# Patient Record
Sex: Male | Born: 1980 | Race: White | Hispanic: No | Marital: Married | Smoking: Never smoker
Health system: Southern US, Community
[De-identification: ages and names within clinical notes are randomized; demographics above are authoritative.]

## PROBLEM LIST (undated history)

## (undated) DIAGNOSIS — G43909 Migraine, unspecified, not intractable, without status migrainosus: Secondary | ICD-10-CM

## (undated) DIAGNOSIS — R197 Diarrhea, unspecified: Secondary | ICD-10-CM

## (undated) DIAGNOSIS — J302 Other seasonal allergic rhinitis: Secondary | ICD-10-CM

## (undated) DIAGNOSIS — K219 Gastro-esophageal reflux disease without esophagitis: Secondary | ICD-10-CM

## (undated) DIAGNOSIS — F32A Depression, unspecified: Secondary | ICD-10-CM

## (undated) DIAGNOSIS — F329 Major depressive disorder, single episode, unspecified: Secondary | ICD-10-CM

## (undated) DIAGNOSIS — M2550 Pain in unspecified joint: Secondary | ICD-10-CM

## (undated) DIAGNOSIS — N529 Male erectile dysfunction, unspecified: Secondary | ICD-10-CM

## (undated) DIAGNOSIS — M549 Dorsalgia, unspecified: Secondary | ICD-10-CM

## (undated) HISTORY — DX: Male erectile dysfunction, unspecified: N52.9

## (undated) HISTORY — DX: Depression, unspecified: F32.A

## (undated) HISTORY — DX: Major depressive disorder, single episode, unspecified: F32.9

## (undated) HISTORY — DX: Dorsalgia, unspecified: M54.9

## (undated) HISTORY — DX: Diarrhea, unspecified: R19.7

## (undated) HISTORY — DX: Gastro-esophageal reflux disease without esophagitis: K21.9

## (undated) HISTORY — DX: Pain in unspecified joint: M25.50

## (undated) HISTORY — DX: Other seasonal allergic rhinitis: J30.2

## (undated) HISTORY — PX: ANKLE SURGERY: SHX546

---

## 2006-07-23 HISTORY — PX: WISDOM TOOTH EXTRACTION: SHX21

## 2016-08-25 ENCOUNTER — Emergency Department (HOSPITAL_BASED_OUTPATIENT_CLINIC_OR_DEPARTMENT_OTHER): Payer: BC Managed Care – PPO

## 2016-08-25 ENCOUNTER — Emergency Department (HOSPITAL_BASED_OUTPATIENT_CLINIC_OR_DEPARTMENT_OTHER)
Admission: EM | Admit: 2016-08-25 | Discharge: 2016-08-25 | Disposition: A | Discharge status: Home / Self Care | Payer: BC Managed Care – PPO | Attending: Emergency Medicine | Admitting: Emergency Medicine

## 2016-08-25 ENCOUNTER — Encounter (HOSPITAL_BASED_OUTPATIENT_CLINIC_OR_DEPARTMENT_OTHER): Payer: Self-pay | Admitting: Emergency Medicine

## 2016-08-25 DIAGNOSIS — R509 Fever, unspecified: Secondary | ICD-10-CM | POA: Insufficient documentation

## 2016-08-25 DIAGNOSIS — J3489 Other specified disorders of nose and nasal sinuses: Secondary | ICD-10-CM | POA: Insufficient documentation

## 2016-08-25 DIAGNOSIS — R062 Wheezing: Secondary | ICD-10-CM | POA: Diagnosis not present

## 2016-08-25 DIAGNOSIS — J111 Influenza due to unidentified influenza virus with other respiratory manifestations: Secondary | ICD-10-CM

## 2016-08-25 DIAGNOSIS — R0602 Shortness of breath: Secondary | ICD-10-CM | POA: Insufficient documentation

## 2016-08-25 DIAGNOSIS — J029 Acute pharyngitis, unspecified: Secondary | ICD-10-CM | POA: Insufficient documentation

## 2016-08-25 DIAGNOSIS — R531 Weakness: Secondary | ICD-10-CM | POA: Diagnosis not present

## 2016-08-25 DIAGNOSIS — Z79899 Other long term (current) drug therapy: Secondary | ICD-10-CM | POA: Insufficient documentation

## 2016-08-25 DIAGNOSIS — R05 Cough: Secondary | ICD-10-CM | POA: Insufficient documentation

## 2016-08-25 DIAGNOSIS — R51 Headache: Secondary | ICD-10-CM | POA: Diagnosis not present

## 2016-08-25 DIAGNOSIS — R69 Illness, unspecified: Secondary | ICD-10-CM

## 2016-08-25 HISTORY — DX: Migraine, unspecified, not intractable, without status migrainosus: G43.909

## 2016-08-25 MED ORDER — IBUPROFEN 400 MG PO TABS
600.0000 mg | ORAL_TABLET | Freq: Once | ORAL | Status: AC
  Administered 2016-08-25: 600 mg via ORAL
  Filled 2016-08-25: qty 1

## 2016-08-25 NOTE — ED Triage Notes (Signed)
Pt reports cough, fever, sore throat, body aches since last night.

## 2016-08-25 NOTE — ED Provider Notes (Signed)
[facility]   CSN: [id number] Arrival date & time: 08/25/16  0311     History   Chief Complaint Chief Complaint  Patient presents with  . Influenza    HPI Mark Barrett is a 36 y.o. male.  HPI  36 year old male presents with fever, cough, sore throat, and body aches since yesterday morning. Woke up with the symptoms. He is a Mudlogger at school. Does not remember any specific sick contacts. Temperature has been up to 102. Took NyQuil last night around 11 PM. Has had a headache and his wife has heard him wheezing. He feels a little short of breath at times. Cough has green and brown sputum. Feels like his throat was red when he looked. No vomiting, abdominal pain, or diarrhea. No neck stiffness or pain. No history of asthma, immunosuppression/cancer.  Past Medical History:  Diagnosis Date  . Migraines     There are no active problems to display for this patient.   Past Surgical History:  Procedure Laterality Date  . ANKLE SURGERY         Home Medications    Prior to Admission medications   Medication Sig Start Date End Date Taking? Authorizing Provider  Topiramate (TOPAMAX PO) Take by mouth.   Yes Historical Provider, MD    Family History No family history on file.  Social History Social History  Substance Use Topics  . Smoking status: Never Smoker  . Smokeless tobacco: Never Used  . Alcohol use No     Allergies   Patient has no known allergies.   Review of Systems Review of Systems  Constitutional: Positive for fever.  HENT: Positive for congestion, sinus pressure and sore throat. Negative for ear pain.   Respiratory: Positive for cough, shortness of breath and wheezing.   Gastrointestinal: Negative for vomiting.  Neurological: Positive for weakness and headaches.  All other systems reviewed and are negative.    Physical Exam Updated Vital Signs BP 144/98   Pulse 97   Temp 100.5 F (38.1 C) (Oral)   Resp 18    SpO2 99%   Physical Exam  Constitutional: He is oriented to person, place, and time. He appears well-developed and well-nourished.  HENT:  Head: Normocephalic and atraumatic.  Right Ear: External ear normal.  Left Ear: External ear normal.  Nose: Nose normal.  Mouth/Throat: Uvula is midline and mucous membranes are normal. Posterior oropharyngeal erythema (mild) present. No oropharyngeal exudate or tonsillar abscesses.  Eyes: Right eye exhibits no discharge. Left eye exhibits no discharge.  Neck: Normal range of motion. Neck supple.  Cardiovascular: Normal rate, regular rhythm and normal heart sounds.   Pulmonary/Chest: Effort normal and breath sounds normal. He has no wheezes. He has no rales.  Abdominal: Soft. He exhibits no distension. There is no tenderness.  Musculoskeletal: He exhibits no edema.  Lymphadenopathy:    He has no cervical adenopathy.  Neurological: He is alert and oriented to person, place, and time.  Skin: Skin is warm and dry.  Nursing note and vitals reviewed.    ED Treatments / Results  Labs (all labs ordered are listed, but only abnormal results are displayed) Labs Reviewed - No data to display  EKG  EKG Interpretation None       Radiology Dg Chest 2 View  Result Date: 08/25/2016 CLINICAL DATA:  Cough and fever for 1 day EXAM: CHEST  2 VIEW COMPARISON:  None. FINDINGS: The heart size and mediastinal contours are within normal limits. Both  lungs are clear. The visualized skeletal structures are unremarkable. IMPRESSION: No active cardiopulmonary disease. Electronically Signed   By: Ellery Plunkaniel R Mitchell M.D.   On: 08/25/2016 03:50    Procedures Procedures (including critical care time)  Medications Ordered in ED Medications  ibuprofen (ADVIL,MOTRIN) tablet 600 mg (600 mg Oral Given 08/25/16 0344)     Initial Impression / Assessment and Plan / ED Course  I have reviewed the triage vital signs and the nursing notes.  Pertinent labs & imaging  results that were available during my care of the patient were reviewed by me and considered in my medical decision making (see chart for details).     Patient symptoms are consistent with influenza. No high risk features to suggest needing Tamiflu or admission. Discussed using ibuprofen, Tylenol, and over-the-counter remedies for his symptoms. Discussed strict return precautions. Chest x-ray without pneumonia and my suspicion from his pneumonia is low. Centor 1. Highly doubt bacterial infection  Final Clinical Impressions(s) / ED Diagnoses   Final diagnoses:  Influenza-like illness    New Prescriptions New Prescriptions   No medications on file     Pricilla LovelessScott Treshaun Carrico, MD 08/25/16 0405

## 2018-04-13 IMAGING — CR DG CHEST 2V
2 series · 2 of 2 positions shown · non-contrast
Comparison: None.

CLINICAL DATA: Cough and fever for 1 day

EXAM:
CHEST  2 VIEW

[w chest pa]
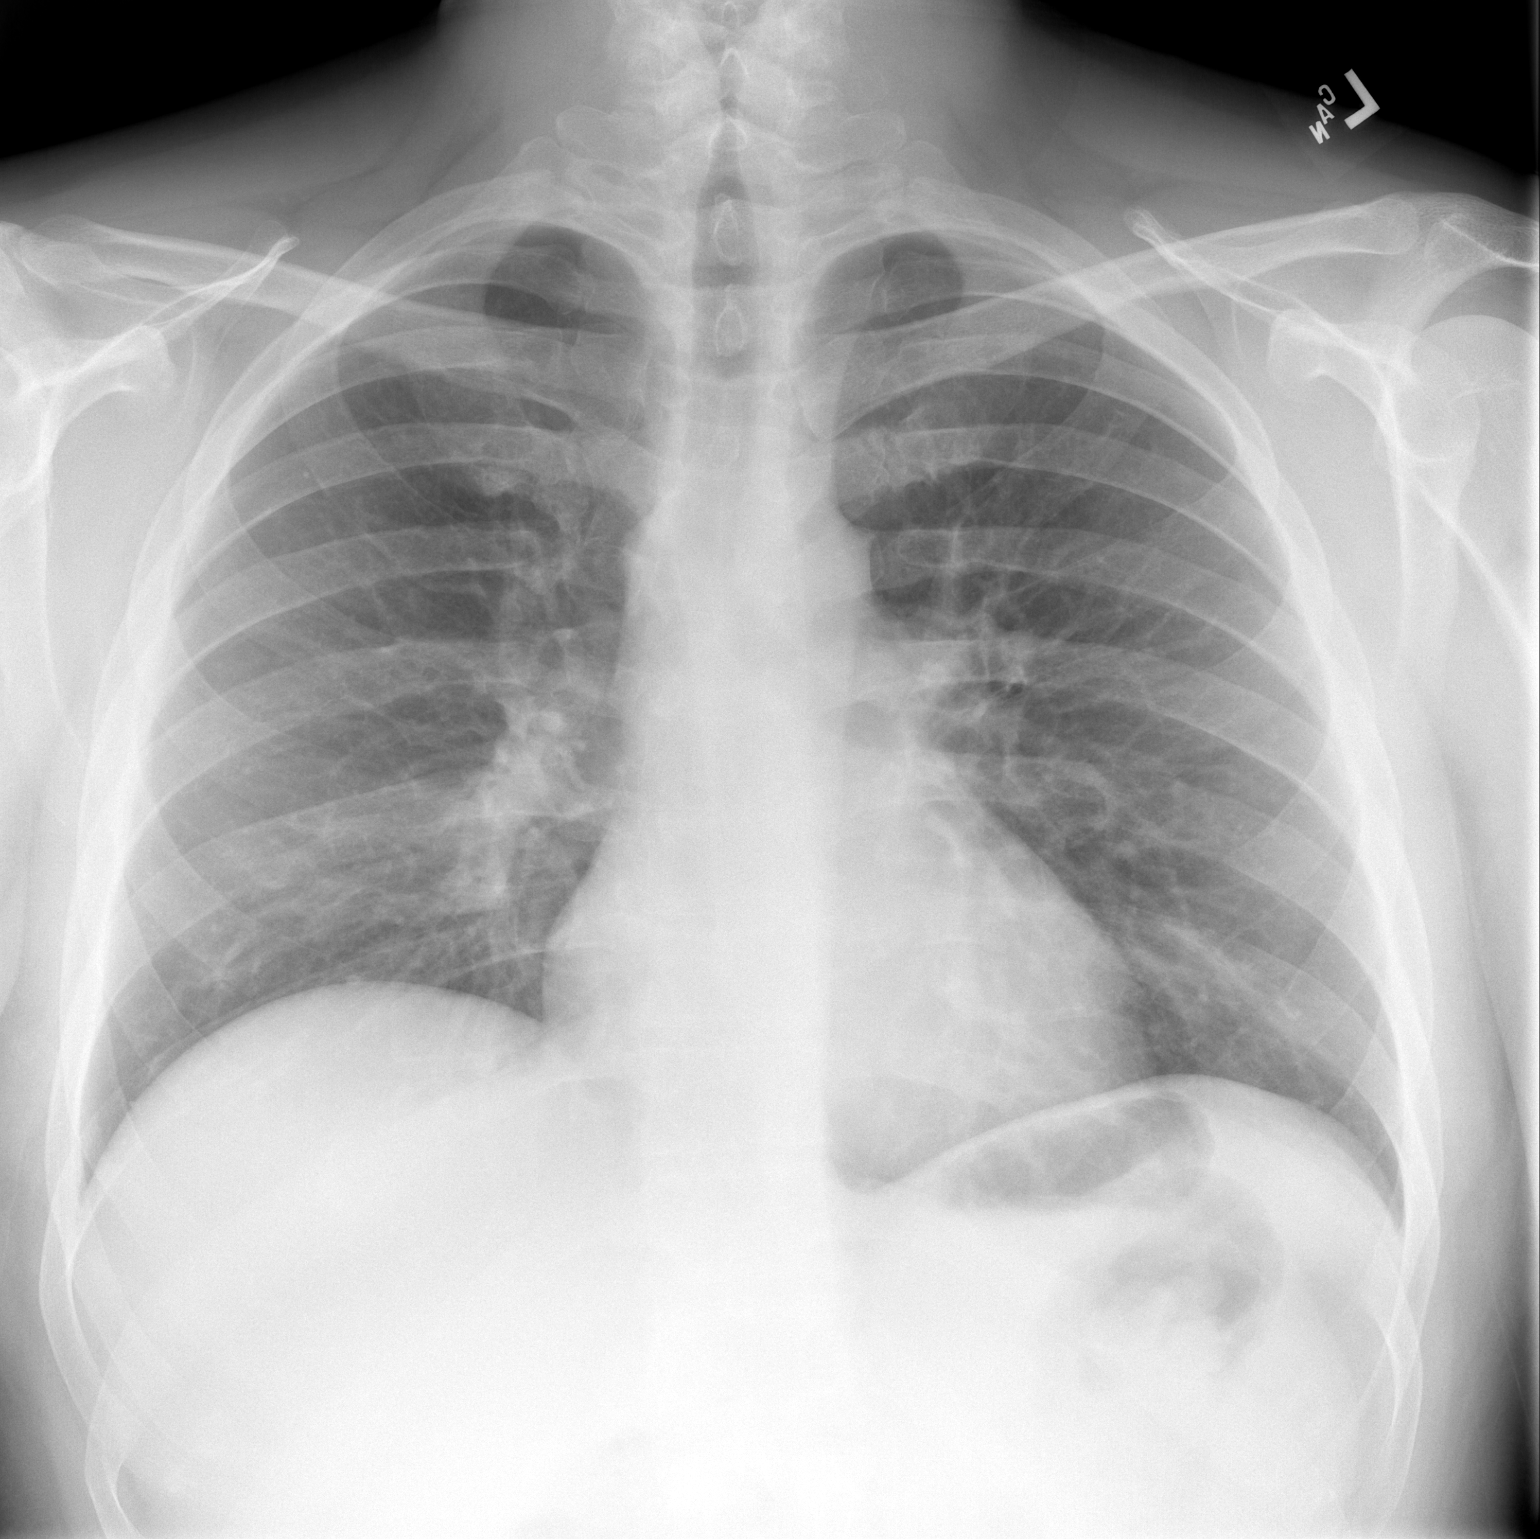

[w chest lat]
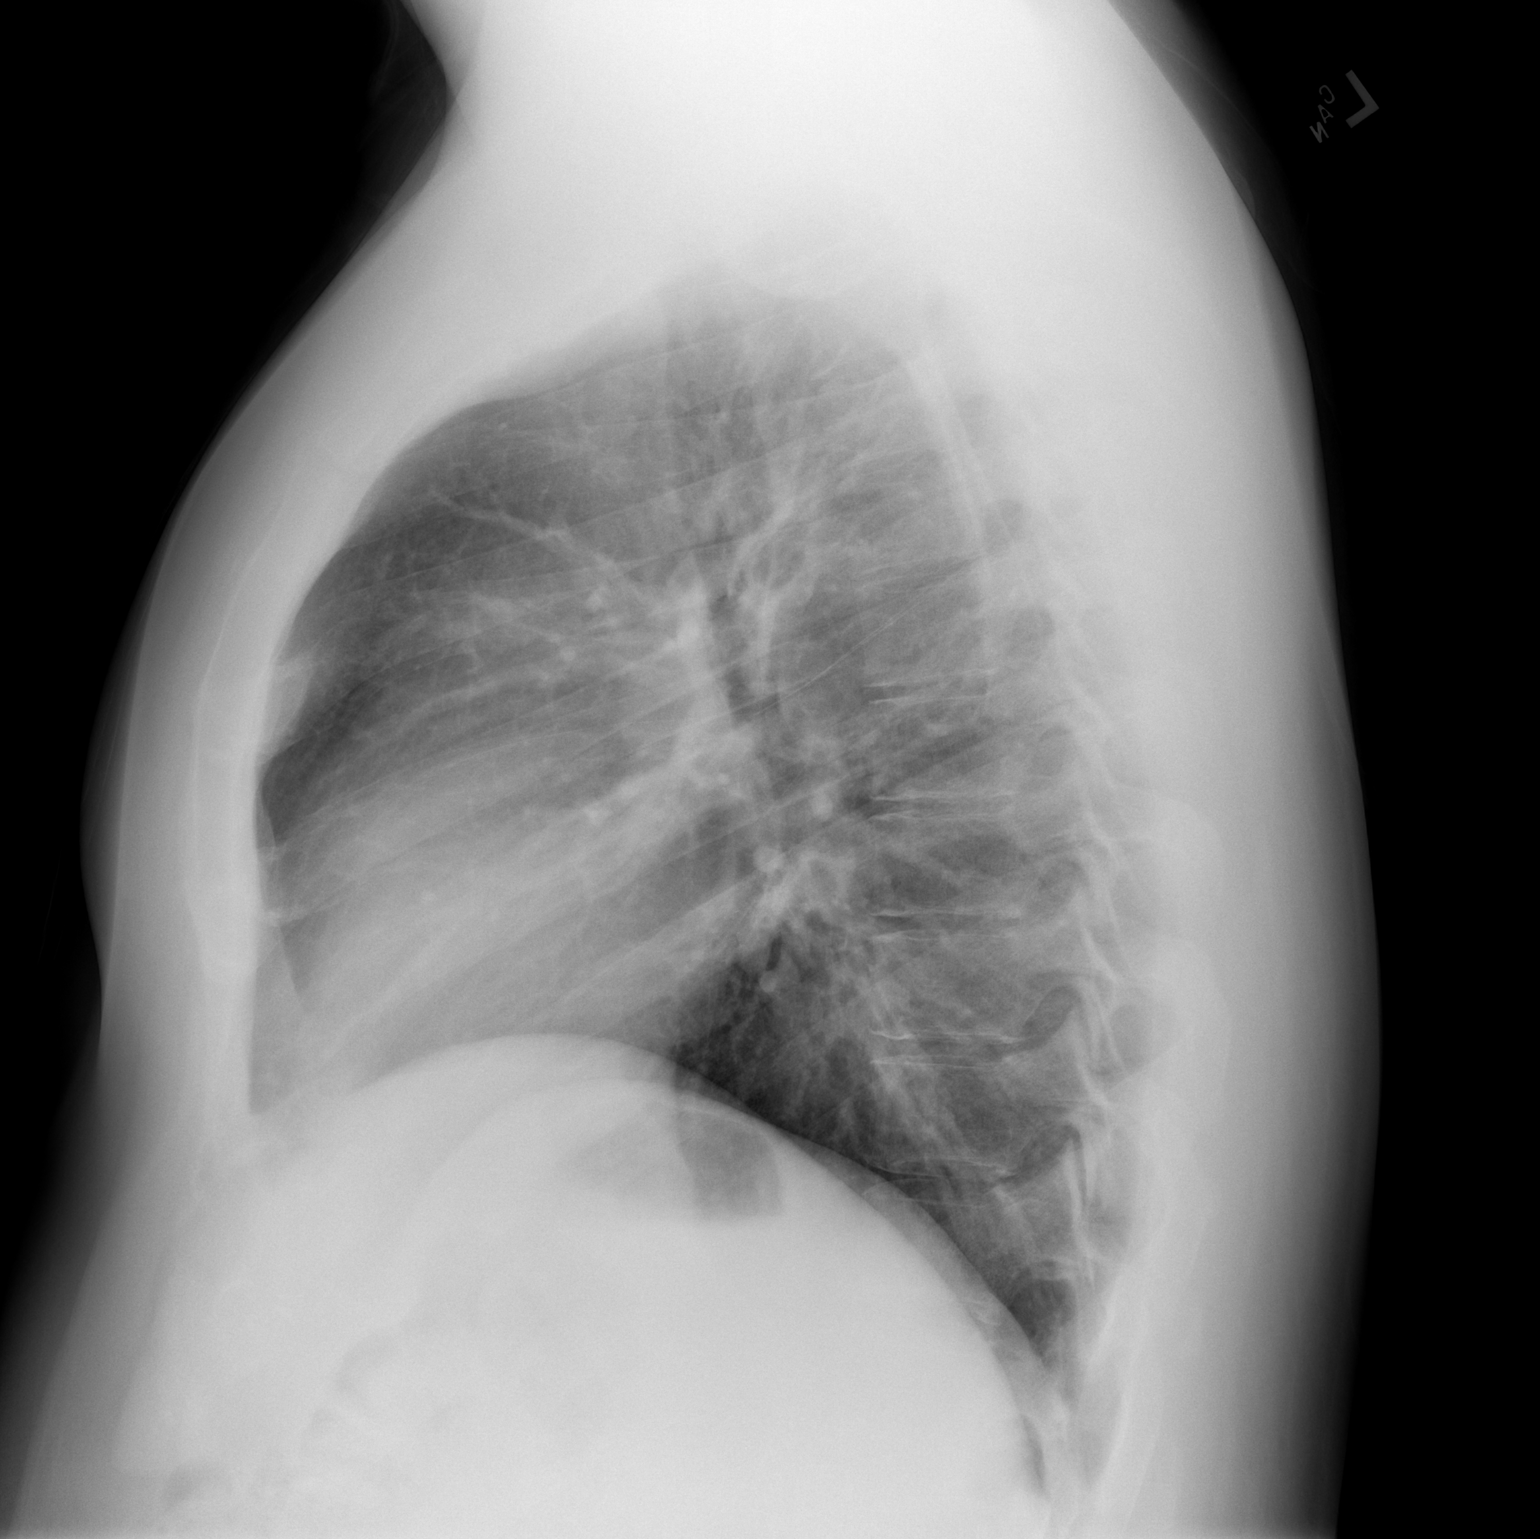

[2 of 2 positions shown; findings below may reference images not displayed]

FINDINGS: The heart size and mediastinal contours are within normal limits.
Both lungs are clear. The visualized skeletal structures are
unremarkable.
IMPRESSION: No active cardiopulmonary disease.

## 2018-05-08 ENCOUNTER — Encounter (INDEPENDENT_AMBULATORY_CARE_PROVIDER_SITE_OTHER): Payer: 59

## 2018-05-19 ENCOUNTER — Encounter (INDEPENDENT_AMBULATORY_CARE_PROVIDER_SITE_OTHER): Payer: Self-pay | Admitting: Family Medicine

## 2018-05-19 ENCOUNTER — Ambulatory Visit (INDEPENDENT_AMBULATORY_CARE_PROVIDER_SITE_OTHER): Payer: 59 | Admitting: Family Medicine

## 2018-05-19 VITALS — BP 141/82 | HR 68 | Temp 98.0°F | Ht 76.0 in | Wt 320.0 lb

## 2018-05-19 DIAGNOSIS — R0602 Shortness of breath: Secondary | ICD-10-CM | POA: Diagnosis not present

## 2018-05-19 DIAGNOSIS — Z9189 Other specified personal risk factors, not elsewhere classified: Secondary | ICD-10-CM | POA: Diagnosis not present

## 2018-05-19 DIAGNOSIS — G43809 Other migraine, not intractable, without status migrainosus: Secondary | ICD-10-CM

## 2018-05-19 DIAGNOSIS — R5383 Other fatigue: Secondary | ICD-10-CM | POA: Diagnosis not present

## 2018-05-19 DIAGNOSIS — Z1331 Encounter for screening for depression: Secondary | ICD-10-CM

## 2018-05-19 DIAGNOSIS — Z6839 Body mass index (BMI) 39.0-39.9, adult: Secondary | ICD-10-CM

## 2018-05-19 DIAGNOSIS — Z0289 Encounter for other administrative examinations: Secondary | ICD-10-CM

## 2018-05-20 LAB — T3: T3, Total: 103 ng/dL (ref 71–180)

## 2018-05-20 LAB — COMPREHENSIVE METABOLIC PANEL
ALBUMIN: 4.8 g/dL (ref 3.5–5.5)
ALK PHOS: 92 IU/L (ref 39–117)
ALT: 71 IU/L — ABNORMAL HIGH (ref 0–44)
AST: 32 IU/L (ref 0–40)
Albumin/Globulin Ratio: 1.8 (ref 1.2–2.2)
BUN/Creatinine Ratio: 23 — ABNORMAL HIGH (ref 9–20)
BUN: 23 mg/dL — AB (ref 6–20)
Bilirubin Total: 0.3 mg/dL (ref 0.0–1.2)
CALCIUM: 9.5 mg/dL (ref 8.7–10.2)
CO2: 20 mmol/L (ref 20–29)
CREATININE: 1 mg/dL (ref 0.76–1.27)
Chloride: 104 mmol/L (ref 96–106)
GFR, EST AFRICAN AMERICAN: 111 mL/min/{1.73_m2} (ref >59)
GFR, EST NON AFRICAN AMERICAN: 96 mL/min/{1.73_m2} (ref >59)
GLUCOSE: 94 mg/dL (ref 65–99)
Globulin, Total: 2.6 g/dL (ref 1.5–4.5)
Potassium: 4.4 mmol/L (ref 3.5–5.2)
SODIUM: 141 mmol/L (ref 134–144)
TOTAL PROTEIN: 7.4 g/dL (ref 6.0–8.5)

## 2018-05-20 LAB — LIPID PANEL WITH LDL/HDL RATIO
CHOLESTEROL TOTAL: 245 mg/dL — AB (ref 100–199)
HDL: 40 mg/dL (ref >39)
LDL Calculated: 173 mg/dL — ABNORMAL HIGH (ref 0–99)
LDl/HDL Ratio: 4.3 ratio — ABNORMAL HIGH (ref 0.0–3.6)
TRIGLYCERIDES: 162 mg/dL — AB (ref 0–149)
VLDL Cholesterol Cal: 32 mg/dL (ref 5–40)

## 2018-05-20 LAB — CBC WITH DIFFERENTIAL
BASOS ABS: 0.1 10*3/uL (ref 0.0–0.2)
Basos: 1 %
EOS (ABSOLUTE): 0.1 10*3/uL (ref 0.0–0.4)
Eos: 1 %
HEMATOCRIT: 44.2 % (ref 37.5–51.0)
HEMOGLOBIN: 15.2 g/dL (ref 13.0–17.7)
IMMATURE GRANS (ABS): 0 10*3/uL (ref 0.0–0.1)
Immature Granulocytes: 0 %
LYMPHS ABS: 1.5 10*3/uL (ref 0.7–3.1)
Lymphs: 22 %
MCH: 29.7 pg (ref 26.6–33.0)
MCHC: 34.4 g/dL (ref 31.5–35.7)
MCV: 86 fL (ref 79–97)
Monocytes Absolute: 0.6 10*3/uL (ref 0.1–0.9)
Monocytes: 8 %
NEUTROS ABS: 4.6 10*3/uL (ref 1.4–7.0)
Neutrophils: 68 %
RBC: 5.12 x10E6/uL (ref 4.14–5.80)
RDW: 12.8 % (ref 12.3–15.4)
WBC: 6.8 10*3/uL (ref 3.4–10.8)

## 2018-05-20 LAB — HEMOGLOBIN A1C
Est. average glucose Bld gHb Est-mCnc: 120 mg/dL
Hgb A1c MFr Bld: 5.8 % — ABNORMAL HIGH (ref 4.8–5.6)

## 2018-05-20 LAB — VITAMIN B12: Vitamin B-12: 505 pg/mL (ref 232–1245)

## 2018-05-20 LAB — TSH: TSH: 3.91 u[IU]/mL (ref 0.450–4.500)

## 2018-05-20 LAB — FOLATE: FOLATE: 9.2 ng/mL (ref >3.0)

## 2018-05-20 LAB — T4, FREE: FREE T4: 1 ng/dL (ref 0.82–1.77)

## 2018-05-20 LAB — INSULIN, RANDOM: INSULIN: 17.3 u[IU]/mL (ref 2.6–24.9)

## 2018-05-20 LAB — VITAMIN D 25 HYDROXY (VIT D DEFICIENCY, FRACTURES): Vit D, 25-Hydroxy: 20.2 ng/mL — ABNORMAL LOW (ref 30.0–100.0)

## 2018-05-20 NOTE — Progress Notes (Signed)
.  Office: [phone]  /  Fax: [phone]   HPI:   Chief Complaint: OBESITY  Mark Barrett (MR# [id number]) is a 37 y.o. male who presents on 05/20/2018 for obesity evaluation and treatment. Current BMI is Body mass index is 38.95 kg/m.Mark Barrett has struggled with obesity for years and has been unsuccessful in either losing weight or maintaining long term weight loss. Mark Barrett heard about our clinic from his wife. Mark Barrett attended our information session and states he is currently in the action stage of change and ready to dedicate time achieving and maintaining a healthier weight.  Mark Barrett states his family eats meals together he thinks his family will eat healthier with  him his desired weight loss is 20, 45 or 70 lbs he started gaining weight in the last few yrs his heaviest weight ever was 320 lbs. he is a picky eater and doesn't like to eat healthier foods  he has significant food cravings issues  he skips meals frequently he is frequently drinking liquids with calories he frequently makes poor food choices he has binge eating behaviors   Fatigue Mark Barrett feels his energy is lower than it should be. This has worsened with weight gain and has not worsened recently. Mark Barrett admits to daytime somnolence and admits to waking up still tired. Patient is at risk for obstructive sleep apnea. Patent has a history of symptoms of daytime fatigue, morning fatigue and morning headache. Patient generally gets 7 or 8 hours of sleep per night, and states they generally have restless sleep. Snoring is present. Apneic episodes are present. Epworth Sleepiness Score is 7  EKG was ordered today which shows normal sinus rhythm.  Dyspnea on exertion Mark Barrett notes increasing shortness of breath with exercising and seems to be worsening over time with weight gain. He notes getting out of breath sooner with activity than he used to. This has not gotten worse recently. EKG was ordered today which shows normal sinus rhythm.  Prabhav denies orthopnea.  Migraines Mark Barrett has a history of migraines and he is on Topiramate and Sumatriptan. He is not seeing a neurologist.  Depression Screen Mark Barrett Food and Mood (modified PHQ-9) score was  Depression screen PHQ 2/9 05/19/2018  Decreased Interest 1  Down, Depressed, Hopeless 1  PHQ - 2 Score 2  Altered sleeping 2  Tired, decreased energy 2  Change in appetite 1  Feeling bad or failure about yourself  1  Trouble concentrating 1  Moving slowly or fidgety/restless 2  Suicidal thoughts 1  PHQ-9 Score 12  Difficult doing work/chores Somewhat difficult    ALLERGIES: No Known Allergies  MEDICATIONS: Current Outpatient Medications on File Prior to Visit  Medication Sig Dispense Refill  . Ascorbic Acid (VITAMIN C) 100 MG tablet Take 250 mg by mouth daily.    . cetirizine (ZYRTEC) 10 MG tablet Take 10 mg by mouth daily.    Marland Kitchen ibuprofen (ADVIL,MOTRIN) 400 MG tablet Take 400 mg by mouth every 6 (six) hours as needed.    . sildenafil (VIAGRA) 25 MG tablet Take 25 mg by mouth daily as needed for erectile dysfunction.    . SUMAtriptan (IMITREX) 50 MG tablet Take 50 mg by mouth every 2 (two) hours as needed for migraine. May repeat in 2 hours if headache persists or recurs.    . Topiramate (TOPAMAX PO) Take by mouth.     No current facility-administered medications on file prior to visit.     PAST MEDICAL HISTORY: Past Medical History:  Diagnosis Date  .  Back pain   . Depression   . Diarrhea   . Erectile dysfunction   . GERD (gastroesophageal reflux disease)   . Joint pain   . Migraines   . Seasonal allergies     PAST SURGICAL HISTORY: Past Surgical History:  Procedure Laterality Date  . ANKLE SURGERY    . WISDOM TOOTH EXTRACTION  2008    SOCIAL HISTORY: Social History   Tobacco Use  . Smoking status: Never Smoker  . Smokeless tobacco: Never Used  Substance Use Topics  . Alcohol use: No  . Drug use: Not on file    FAMILY HISTORY: Family History   Problem Relation Age of Onset  . Hypertension Mother   . Hyperlipidemia Mother   . Obesity Mother   . Heart disease Father   . Cancer Father   . Sleep apnea Father     ROS: Review of Systems  Constitutional: Positive for malaise/fatigue.  HENT:       + Difficult or Painful Swallowing  Respiratory: Positive for shortness of breath (on exertion).   Cardiovascular: Negative for orthopnea.  Gastrointestinal: Positive for diarrhea and heartburn.  Musculoskeletal: Positive for back pain.       + Muscle or Joint Pain  Neurological: Positive for headaches.  Psychiatric/Behavioral: Positive for depression. The patient has insomnia.     PHYSICAL EXAM: Blood pressure (!) 141/82, pulse 68, temperature 98 F (36.7 C), temperature source Oral, height 6\' 4"  (1.93 m), weight (!) 320 lb (145.2 kg), SpO2 96 %. Body mass index is 38.95 kg/m. Physical Exam  Constitutional: He is oriented to person, place, and time. He appears well-developed and well-nourished.  HENT:  Head: Normocephalic and atraumatic.  Nose: Nose normal.  Eyes: EOM are normal.  Neck: Normal range of motion. Neck supple. No thyromegaly present.  Cardiovascular: Normal rate and regular rhythm.  Pulmonary/Chest: Effort normal. No respiratory distress.  Abdominal: Soft. There is no tenderness.  + Obesity  Musculoskeletal: Normal range of motion.  Range of Motion normal in all 4 extremities  Neurological: He is alert and oriented to person, place, and time.  Skin: Skin is warm and dry.  Psychiatric: He has a normal mood and affect. His behavior is normal.  Vitals reviewed.   RECENT LABS AND TESTS: BMET    Component Value Date/Time   NA 141 05/19/2018 1223   K 4.4 05/19/2018 1223   CL 104 05/19/2018 1223   CO2 20 05/19/2018 1223   GLUCOSE 94 05/19/2018 1223   BUN 23 (H) 05/19/2018 1223   CREATININE 1.00 05/19/2018 1223   CALCIUM 9.5 05/19/2018 1223   GFRNONAA 96 05/19/2018 1223   GFRAA 111 05/19/2018 1223    Lab Results  Component Value Date   HGBA1C 5.8 (H) 05/19/2018   Lab Results  Component Value Date   INSULIN 17.3 05/19/2018   CBC    Component Value Date/Time   WBC 6.8 05/19/2018 1223   RBC 5.12 05/19/2018 1223   HGB 15.2 05/19/2018 1223   HCT 44.2 05/19/2018 1223   MCV 86 05/19/2018 1223   MCH 29.7 05/19/2018 1223   MCHC 34.4 05/19/2018 1223   RDW 12.8 05/19/2018 1223   LYMPHSABS 1.5 05/19/2018 1223   EOSABS 0.1 05/19/2018 1223   BASOSABS 0.1 05/19/2018 1223   Iron/TIBC/Ferritin/ %Sat No results found for: IRON, TIBC, FERRITIN, IRONPCTSAT Lipid Panel     Component Value Date/Time   CHOL 245 (H) 05/19/2018 1223   TRIG 162 (H) 05/19/2018 1223   HDL 40  05/19/2018 1223   LDLCALC 173 (H) 05/19/2018 1223   Hepatic Function Panel     Component Value Date/Time   PROT 7.4 05/19/2018 1223   ALBUMIN 4.8 05/19/2018 1223   AST 32 05/19/2018 1223   ALT 71 (H) 05/19/2018 1223   ALKPHOS 92 05/19/2018 1223   BILITOT 0.3 05/19/2018 1223      Component Value Date/Time   TSH 3.910 05/19/2018 1223   Vitamin D There are no recent lab results  ECG  shows NSR with a rate of 75 BPM INDIRECT CALORIMETER done today shows a VO2 of 395 and a REE of 2748. His calculated basal metabolic rate is 5284 thus his basal metabolic rate is worse than expected.    ASSESSMENT AND PLAN: Other fatigue - Plan: EKG 12-Lead, Vitamin B12, CBC With Differential, Comprehensive metabolic panel, Folate, Hemoglobin A1c, Insulin, random, Lipid Panel With LDL/HDL Ratio, T3, T4, free, TSH, VITAMIN D 25 Hydroxy (Vit-D Deficiency, Fractures)  Shortness of breath on exertion  Other migraine without status migrainosus, not intractable  Depression screening  At risk for sleep apnea  Class 2 severe obesity with serious comorbidity and body mass index (BMI) of 39.0 to 39.9 in adult, unspecified obesity type (HCC)  PLAN:  Fatigue Mark Barrett was informed that his fatigue may be related to obesity,  depression or many other causes. Labs will be ordered, and in the meanwhile Mark Barrett has agreed to work on diet, exercise and weight loss to help with fatigue. Proper sleep hygiene was discussed including the need for 7-8 hours of quality sleep each night. We will refer to Neurology/Sleep Medicine for possible sleep study. We will order indirect calorimetry and EKG today.  Dyspnea on exertion Mark Barrett's shortness of breath appears to be obesity related and exercise induced. He has agreed to work on weight loss and gradually increase exercise to treat his exercise induced shortness of breath. If Jaimere follows our instructions and loses weight without improvement of his shortness of breath, we will plan to refer to pulmonology. We will order indirect calorimetry, EKG and labs today. We will monitor this condition regularly. Mark Barrett agrees to this plan.  Obstructive Sleep Apnea Risk Counseling Mark Barrett was given extended  (15 minutes) coronary artery disease prevention counseling today. He is 37 y.o. male and has risk factors for obstructive sleep apnea including obesity. We discussed intensive lifestyle modifications today with an emphasis on specific weight loss instructions and strategies.  Migraines Mark Barrett will continue Topiramate and Sumatriptan and will follow up with our clinic in 2 weeks.  Depression Screen Mark Barrett had a moderately positive depression screening. Depression is commonly associated with obesity and often results in emotional eating behaviors. We will monitor this closely and work on CBT to help improve the non-hunger eating patterns. Referral to Psychology may be required if no improvement is seen as he continues in our clinic.  Obesity Mark Barrett is currently in the action stage of change and his goal is to continue with weight loss efforts He has agreed to follow the Category 4 plan Mark Barrett has been instructed to work up to a goal of 150 minutes of combined cardio and strengthening exercise per  week for weight loss and overall health benefits. We discussed the following Behavioral Modification Strategies today: planning for success, increasing lean protein intake, increasing vegetables, decrease eating out and work on meal planning and easy cooking plans  Mark Barrett has agreed to follow up with our clinic in 2 weeks. He was informed of the importance of frequent follow  up visits to maximize his success with intensive lifestyle modifications for his multiple health conditions. He was informed we would discuss his lab results at his next visit unless there is a critical issue that needs to be addressed sooner. Mark Barrett agreed to keep his next visit at the agreed upon time to discuss these results.    OBESITY BEHAVIORAL INTERVENTION VISIT  Today's visit was # 1   Starting weight: 320 lbs Starting date: 05/19/18 Today's weight : 320 lbs  Today's date: 05/19/2018 Total lbs lost to date: 0   ASK: We discussed the diagnosis of obesity with Mark Barrett today and Mark Barrett agreed to give [place] permission to discuss obesity behavioral modification therapy today.  ASSESS: Mark Barrett has the diagnosis of obesity and his BMI today is 38.97 Mark Barrett is in the action stage of change   ADVISE: Jyair was educated on the multiple health risks of obesity as well as the benefit of weight loss to improve his health. He was advised of the need for long term treatment and the importance of lifestyle modifications to improve his current health and to decrease his risk of future health problems.  AGREE: Multiple dietary modification options and treatment options were discussed and  Mark Barrett agreed to follow the recommendations documented in the above note.  ARRANGE: Mark Barrett was educated on the importance of frequent visits to treat obesity as outlined per CMS and USPSTF guidelines and agreed to schedule his next follow up appointment today.   I, [place] Crane, am acting as transcriptionist for Filbert Schilder,  MD   I have reviewed the above documentation for accuracy and completeness, and I agree with the above. - Debbra Riding, MD

## 2018-06-04 ENCOUNTER — Ambulatory Visit (INDEPENDENT_AMBULATORY_CARE_PROVIDER_SITE_OTHER): Payer: 59 | Admitting: Family Medicine

## 2018-06-04 VITALS — BP 136/78 | HR 64 | Temp 98.2°F | Ht 76.0 in | Wt 318.0 lb

## 2018-06-04 DIAGNOSIS — E559 Vitamin D deficiency, unspecified: Secondary | ICD-10-CM | POA: Diagnosis not present

## 2018-06-04 DIAGNOSIS — Z9189 Other specified personal risk factors, not elsewhere classified: Secondary | ICD-10-CM

## 2018-06-04 DIAGNOSIS — R7303 Prediabetes: Secondary | ICD-10-CM

## 2018-06-04 DIAGNOSIS — E785 Hyperlipidemia, unspecified: Secondary | ICD-10-CM

## 2018-06-04 DIAGNOSIS — R5383 Other fatigue: Secondary | ICD-10-CM

## 2018-06-04 DIAGNOSIS — Z6838 Body mass index (BMI) 38.0-38.9, adult: Secondary | ICD-10-CM

## 2018-06-04 MED ORDER — VITAMIN D (ERGOCALCIFEROL) 1.25 MG (50000 UNIT) PO CAPS: 50000.0000 [IU] | ORAL_CAPSULE | ORAL | 0 refills | Status: DC

## 2018-06-10 ENCOUNTER — Encounter: Payer: Self-pay | Admitting: Neurology

## 2018-06-10 ENCOUNTER — Ambulatory Visit (INDEPENDENT_AMBULATORY_CARE_PROVIDER_SITE_OTHER): Payer: 59 | Admitting: Neurology

## 2018-06-10 VITALS — BP 142/82 | HR 72 | Ht 77.0 in | Wt 322.0 lb

## 2018-06-10 DIAGNOSIS — R519 Headache, unspecified: Secondary | ICD-10-CM

## 2018-06-10 DIAGNOSIS — R0683 Snoring: Secondary | ICD-10-CM

## 2018-06-10 DIAGNOSIS — Z9189 Other specified personal risk factors, not elsewhere classified: Secondary | ICD-10-CM

## 2018-06-10 DIAGNOSIS — R51 Headache: Secondary | ICD-10-CM

## 2018-06-10 DIAGNOSIS — G4719 Other hypersomnia: Secondary | ICD-10-CM

## 2018-06-10 DIAGNOSIS — R0681 Apnea, not elsewhere classified: Secondary | ICD-10-CM

## 2018-06-10 DIAGNOSIS — E669 Obesity, unspecified: Secondary | ICD-10-CM

## 2018-06-10 DIAGNOSIS — G478 Other sleep disorders: Secondary | ICD-10-CM

## 2018-06-10 DIAGNOSIS — Z820 Family history of epilepsy and other diseases of the nervous system: Secondary | ICD-10-CM

## 2018-06-10 NOTE — Progress Notes (Signed)
Office: [phone]  /  Fax: [phone]   HPI:   Chief Complaint: OBESITY Mark Barrett is here to discuss his progress with his obesity treatment plan. He is on the  follow the Category 4 plan and is following his eating plan approximately 50-60 % of the time. He states he is exercising 0 minutes 0 times per week. Mark Barrett reports the quantity of food is sufficient, but he sometimes struggles to eat all of the food and sometimes feels he could've eaten more at breakfast and lunch. He is eating breakfast at home occasionally eating out at lunch secondary to being in the on the run at work.  His weight is (!) 318 lb (144.2 kg) today and has had a weight loss of 2 pounds over a period of 2 weeks since his last visit. He has lost 2 lbs since starting treatment with [place].  Hyperlipidemia Mark Barrett has hyperlipidemia. His LDL significantly elevated and has been trying to improve his cholesterol levels with intensive lifestyle modification including a low saturated fat diet, exercise and weight loss. He denies any chest pain, claudication or myalgias. He is not currently on any medications.   Vitamin D deficiency Mark Barrett has a diagnosis of vitamin D deficiency. He is not currently taking vit D and denies nausea, vomiting or muscle weakness. He reports fatigue.   Ref. Range 05/19/2018 12:23  Vitamin D, 25-Hydroxy Latest Ref Range: 30.0 - 100.0 ng/mL 20.2 (L)   Pre-Diabetes Mark Barrett has a diagnosis of prediabetes based on his elevated HgA1c and was informed this puts him at greater risk of developing diabetes. He is not taking metformin currently and continues to work on diet and exercise to decrease risk of diabetes. He denies nausea or hypoglycemia. He reports occasional carbohydrate cravings.   Fatigue Mark Barrett feels his energy is lower than it should be. This has worsened with weight gain and has not worsened recently. Mark Barrett admits to daytime somnolence and patient admits to sleeping at least 7 hors per night. He  reports snoring.   Diabetes risk counseling Mark Barrett was given extended (15 minutes) diabetes prevention counseling today. He is 37 y.o. male and has risk factors for diabetes including obesity. We discussed intensive lifestyle modifications today with an emphasis on weight loss as well as increasing exercise and decreasing simple carbohydrates in his diet.   ALLERGIES: No Known Allergies  MEDICATIONS: Current Outpatient Medications on File Prior to Visit  Medication Sig Dispense Refill  . Ascorbic Acid (VITAMIN C) 100 MG tablet Take 250 mg by mouth daily.    . cetirizine (ZYRTEC) 10 MG tablet Take 10 mg by mouth daily.    Marland Kitchen ibuprofen (ADVIL,MOTRIN) 400 MG tablet Take 400 mg by mouth every 6 (six) hours as needed.    . sildenafil (VIAGRA) 25 MG tablet Take 25 mg by mouth daily as needed for erectile dysfunction.    . SUMAtriptan (IMITREX) 50 MG tablet Take 50 mg by mouth every 2 (two) hours as needed for migraine. May repeat in 2 hours if headache persists or recurs.    . Topiramate (TOPAMAX PO) Take by mouth.     No current facility-administered medications on file prior to visit.     PAST MEDICAL HISTORY: Past Medical History:  Diagnosis Date  . Back pain   . Depression   . Diarrhea   . Erectile dysfunction   . GERD (gastroesophageal reflux disease)   . Joint pain   . Migraines   . Seasonal allergies     PAST SURGICAL  HISTORY: Past Surgical History:  Procedure Laterality Date  . ANKLE SURGERY    . WISDOM TOOTH EXTRACTION  2008    SOCIAL HISTORY: Social History   Tobacco Use  . Smoking status: Never Smoker  . Smokeless tobacco: Never Used  Substance Use Topics  . Alcohol use: No  . Drug use: Not on file    FAMILY HISTORY: Family History  Problem Relation Age of Onset  . Hypertension Mother   . Hyperlipidemia Mother   . Obesity Mother   . Heart disease Father   . Cancer Father   . Sleep apnea Father     ROS: Review of Systems  Constitutional: Positive  for malaise/fatigue and weight loss.  Gastrointestinal: Negative for nausea and vomiting.  Musculoskeletal:       Negative for muscle weakness  Endo/Heme/Allergies: Negative for polydipsia.       Negative for polyuria Negative for hypoglycemia    PHYSICAL EXAM: Blood pressure 136/78, pulse 64, temperature 98.2 F (36.8 C), temperature source Oral, height 6\' 4"  (1.93 m), weight (!) 318 lb (144.2 kg), SpO2 97 %. Body mass index is 38.71 kg/m. Physical Exam  Constitutional: He is oriented to person, place, and time. He appears well-developed and well-nourished.  HENT:  Head: Normocephalic.  Neck: Normal range of motion.  Cardiovascular: Normal rate.  Pulmonary/Chest: Effort normal.  Musculoskeletal: Normal range of motion.  Neurological: He is alert and oriented to person, place, and time.  Skin: Skin is warm and dry.  Psychiatric: He has a normal mood and affect. His behavior is normal.  Vitals reviewed.   RECENT LABS AND TESTS: BMET    Component Value Date/Time   NA 141 05/19/2018 1223   K 4.4 05/19/2018 1223   CL 104 05/19/2018 1223   CO2 20 05/19/2018 1223   GLUCOSE 94 05/19/2018 1223   BUN 23 (H) 05/19/2018 1223   CREATININE 1.00 05/19/2018 1223   CALCIUM 9.5 05/19/2018 1223   GFRNONAA 96 05/19/2018 1223   GFRAA 111 05/19/2018 1223   Lab Results  Component Value Date   HGBA1C 5.8 (H) 05/19/2018   Lab Results  Component Value Date   INSULIN 17.3 05/19/2018   CBC    Component Value Date/Time   WBC 6.8 05/19/2018 1223   RBC 5.12 05/19/2018 1223   HGB 15.2 05/19/2018 1223   HCT 44.2 05/19/2018 1223   MCV 86 05/19/2018 1223   MCH 29.7 05/19/2018 1223   MCHC 34.4 05/19/2018 1223   RDW 12.8 05/19/2018 1223   LYMPHSABS 1.5 05/19/2018 1223   EOSABS 0.1 05/19/2018 1223   BASOSABS 0.1 05/19/2018 1223   Iron/TIBC/Ferritin/ %Sat No results found for: IRON, TIBC, FERRITIN, IRONPCTSAT Lipid Panel     Component Value Date/Time   CHOL 245 (H) 05/19/2018 1223    TRIG 162 (H) 05/19/2018 1223   HDL 40 05/19/2018 1223   LDLCALC 173 (H) 05/19/2018 1223   Hepatic Function Panel     Component Value Date/Time   PROT 7.4 05/19/2018 1223   ALBUMIN 4.8 05/19/2018 1223   AST 32 05/19/2018 1223   ALT 71 (H) 05/19/2018 1223   ALKPHOS 92 05/19/2018 1223   BILITOT 0.3 05/19/2018 1223      Component Value Date/Time   TSH 3.910 05/19/2018 1223    Ref. Range 05/19/2018 12:23  Vitamin D, 25-Hydroxy Latest Ref Range: 30.0 - 100.0 ng/mL 20.2 (L)    ASSESSMENT AND PLAN: Hyperlipidemia, unspecified hyperlipidemia type  Vitamin D deficiency - Plan: Vitamin D, Ergocalciferol, (DRISDOL)  1.25 MG (50000 UT) CAPS capsule  Pre-diabetes  Other fatigue - Plan: Ambulatory referral to Neurology  At risk for diabetes mellitus  Class 2 severe obesity with serious comorbidity and body mass index (BMI) of 38.0 to 38.9 in adult, unspecified obesity type (HCC)  PLAN: Hyperlipidemia Mark Barrett was informed of the American Heart Association Guidelines emphasizing intensive lifestyle modifications as the first line treatment for hyperlipidemia. We discussed many lifestyle modifications today in depth, and Mark Barrett will continue to work on decreasing saturated fats such as fatty red meat, butter and many fried foods. He will also increase vegetables and lean protein in his diet and continue to work on exercise and weight loss efforts. We will repeat labs in 3 months. Agrees to follow up with our clinic as directed.   Vitamin D Deficiency Mark Barrett was informed that low vitamin D levels contributes to fatigue and are associated with obesity, breast, and colon cancer. He agrees to start to take prescription Vit D @50 ,000 IU every week #4 with no refills and will follow up for routine testing of vitamin D, at least 2-3 times per year. He was informed of the risk of over-replacement of vitamin D and agrees to not increase his dose unless he discusses this with us first. Agrees to follow up  with our clinic as directed.   Pre-Diabetes Mark Barrett will continue to work on weight loss, exercise, and decreasing simple carbohydrates in his diet to help decrease the risk of diabetes. We dicussed metformin including benefits and risks. He was informed that eating too many simple carbohydrates or too many calories at one sitting increases the likelihood of GI side effects. Mark Barrett declined metformin for now and a prescription was not written today. Mark Barrett agreed to follow up with us as directed to monitor his progress. We will repeat HgbA1c and Insulin in 3 months.   Fatigue Mark Barrett was informed that his fatigue may be related to obesity, depression or many other causes. Labs will be ordered, and in the meanwhile Mark Barrett has agreed to work on diet, exercise and weight loss to help with fatigue. Proper sleep hygiene was discussed including the need for 7-8 hours of quality sleep each night. [facility]. Agrees to follow up with our clinic as directed.    Diabetes risk counseling Mark Barrett was given extended (15 minutes) diabetes prevention counseling today. He is 37 y.o. male and has risk factors for diabetes including obesity. We discussed intensive lifestyle modifications today with an emphasis on weight loss as well as increasing exercise and decreasing simple carbohydrates in his diet.  Obesity Mark Barrett is currently in the action stage of change. As such, his goal is to continue with weight loss efforts He has agreed to follow the Category 4 plan.  Mark Barrett has been instructed to work up to a goal of 150 minutes of combined cardio and strengthening exercise per week for weight loss and overall health benefits. We discussed the following Behavioral Modification Strategies today: increasing lean protein intake, increasing vegetables, planning for success, and work on meal planning and easy cooking plans.    Mark Barrett has agreed to follow up with our clinic in 2 weeks. He was  informed of the importance of frequent follow up visits to maximize his success with intensive lifestyle modifications for his multiple health conditions.   OBESITY BEHAVIORAL INTERVENTION VISIT  Today's visit was # 2   Starting weight: 320 lb Starting date: 05/19/18 Today's weight : Weight: (!) 318 lb (144.2 kg)  Today's date: 06/04/18 Total lbs lost to date: 2 lb    ASK: We discussed the diagnosis of obesity with Mark Barrett today and Mark Barrett agreed to give [place] permission to discuss obesity behavioral modification therapy today.  ASSESS: Mark Barrett has the diagnosis of obesity and his BMI today is 38.72 Mark Barrett is in the action stage of change   ADVISE: Mark Barrett was educated on the multiple health risks of obesity as well as the benefit of weight loss to improve his health. He was advised of the need for long term treatment and the importance of lifestyle modifications to improve his current health and to decrease his risk of future health problems.  AGREE: Multiple dietary modification options and treatment options were discussed and  Mark Barrett agreed to follow the recommendations documented in the above note.  ARRANGE: Mark Barrett was educated on the importance of frequent visits to treat obesity as outlined per CMS and USPSTF guidelines and agreed to schedule his next follow up appointment today.  I, Jeralene Peters, am acting as transcriptionist for Debbra Riding, MD   I have reviewed the above documentation for accuracy and completeness, and I agree with the above. - Debbra Riding, MD

## 2018-06-10 NOTE — Patient Instructions (Addendum)

## 2018-06-10 NOTE — Progress Notes (Signed)
Subjective:    Patient ID: Mark Barrett is a 37 y.o. male.  HPI     Mark FoleySaima Willistine Ferrall, MD, [name] [facility] [address], [address] [address] [place], [place] [address]  Dear Dr. Rinaldo Barrett,  I saw your patient, Mark Barrett, upon your kind request in my sleep clinic today for initial consultation of his sleep disorder, in particular, concern for underlying obstructive sleep apnea. The patient is unaccompanied today. As you know, Mark Barrett is a 37 year old right-handed gentleman with an underlying medical history of prediabetes, hyperlipidemia, vitamin D deficiency, and obesity, who reports snoring, nonrestorative sleep and excessive daytime somnolence. I reviewed your office note from 06/04/2018. Epworth sleepiness score is 6 out of 24 today, fatigue score is 33 out of 63. He has multiple nighttime awakenings. 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 He is married and lives with his wife and children, they have 2 kids (806 yo and 469 yo girls). He is a nonsmoker and does not utilize alcohol and drinks caffeine in the form of soda, less than 1 per day on average. He does watch TV in the bedroom, tries to turn it off. Likes to watch sports. In bed by 10:30 PM. Rise time 6:30, no night to night nocturia, but has had AM HAs, hx of migraines. Father has OSA, has a PAP machine. No telltale RLS Sx, no Hx of PLMs. His wife has mentioned apneic breathing pauses while he is asleep. He recently started prescription vitamin D. His level was 20.  His Past Medical History Is Significant For: Past Medical History:  Diagnosis Date  . Back pain   . Depression   . Diarrhea   . Erectile dysfunction   . GERD (gastroesophageal reflux disease)   . Joint pain   . Migraines   . Seasonal allergies     His Past Surgical History Is Significant For: Past Surgical History:  Procedure Laterality Date  . ANKLE SURGERY    . WISDOM TOOTH EXTRACTION  2008    His Family History Is  Significant For: Family History  Problem Relation Age of Onset  . Hypertension Mother   . Hyperlipidemia Mother   . Obesity Mother   . Heart disease Father   . Cancer Father   . Sleep apnea Father     His Social History Is Significant For: Social History   Socioeconomic History  . Marital status: Married    Spouse name: Mark Barrett  . Number of children: 2  . Years of education: Not on file  . Highest education level: Not on file  Occupational History  . 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 Financial resource strain: Not on file  . Food insecurity:    Worry: Not on file    Inability: Not on file  . Transportation needs:    Medical: Not on file    Non-medical: Not on file  Tobacco Use  . Smoking status: Never Smoker  . Smokeless tobacco: Never Used  Substance and Sexual Activity  . Alcohol use: No  . Drug use: Not on file  . Sexual activity: Not on file  Lifestyle  . Physical activity:    Days per week: Not on file    Minutes per session: Not on file  . Stress: Not on file  Relationships  . Social connections:    Talks on phone: Not on file    Gets together: Not on file    Attends religious service: Not on  file    Active member of club or organization: Not on file    Attends meetings of clubs or organizations: Not on file    Relationship status: Not on file  Other Topics Concern  . Not on file  Social History Narrative  . Not on file    His Allergies Are:  No Known Allergies:   His Current Medications Are:  Outpatient Encounter Medications as of 06/10/2018  Medication Sig  . Ascorbic Acid (VITAMIN C) 100 MG tablet Take 250 mg by mouth daily.  . cetirizine (ZYRTEC) 10 MG tablet Take 10 mg by mouth daily.  Marland Kitchen ibuprofen (ADVIL,MOTRIN) 400 MG tablet Take 400 mg by mouth every 6 (six) hours as needed.  . sildenafil (VIAGRA) 25 MG tablet Take 25 mg by mouth daily as needed for erectile dysfunction.  . SUMAtriptan (IMITREX) 50 MG tablet Take 50 mg by  mouth every 2 (two) hours as needed for migraine. May repeat in 2 hours if headache persists or recurs.  . Topiramate (TOPAMAX PO) Take 100 mg by mouth 2 (two) times daily.   . Vitamin D, Ergocalciferol, (DRISDOL) 1.25 MG (50000 UT) CAPS capsule Take 1 capsule (50,000 Units total) by mouth every 7 (seven) days.   No facility-administered encounter medications on file as of 06/10/2018.   :  Review of Systems:  Out of a complete 14 point review of systems, all are reviewed and negative with the exception of these symptoms as listed below: Review of Systems  Neurological:       Pt presents today to discuss his sleep. Pt has never had a sleep study but does endorse snoring.  Epworth Sleepiness Scale 0= would never doze 1= slight chance of dozing 2= moderate chance of dozing 3= high chance of dozing  Sitting and reading: 1 Watching TV: 1 Sitting inactive in a public place (ex. Theater or meeting): 1 As a passenger in a car for an hour without a break: 0 Lying down to rest in the afternoon: 2 Sitting and talking to someone: 0 Sitting quietly after lunch (no alcohol): 1 In a car, while stopped in traffic: 0 Total: 6     Objective:  Neurological Exam  Physical Exam Physical Examination:   Vitals:   06/10/18 1454  BP: (!) 142/82  Pulse: 72    General Examination: The patient is a very pleasant 37 y.o. male in no acute distress. He appears well-developed and well-nourished and well groomed.   HEENT: Normocephalic, atraumatic, pupils are equal, round and reactive to light and accommodation. fExtraocular tracking is good without limitation to gaze excursion or nystagmus noted. Normal smooth pursuit is noted. Hearing is grossly intact. Face is symmetric with normal facial animation and normal facial sensation. Speech is clear with no dysarthria noted. There is no hypophonia. There is no lip, neck/head, jaw or voice tremor. Neck is supple with full range of passive and active motion.  There are no carotid bruits on auscultation. Oropharynx exam reveals: mild mouth dryness, good dental hygiene and marked airway crowding, due to larger tonsils, 3+ on the L and 2+ on the R, larger uvula. Mallampati is class II. Tongue protrudes centrally and palate elevates symmetrically. Neck of 17 7/8 inches. He has a Mild overbite.   Chest: Clear to auscultation without wheezing, rhonchi or crackles noted.  Heart: S1+S2+0, regular and normal without murmurs, rubs or gallops noted.   Abdomen: Soft, non-tender and non-distended with normal bowel sounds appreciated on auscultation.  Extremities: There is no pitting  edema in the distal lower extremities bilaterally. Pedal pulses are intact.  Skin: Warm and dry without trophic changes noted. There are no varicose veins.  Musculoskeletal: exam reveals no obvious joint deformities, tenderness or joint swelling or erythema.   Neurologically:  Mental status: The patient is awake, alert and oriented in all 4 spheres. His immediate and remote memory, attention, language skills and fund of knowledge are appropriate. There is no evidence of aphasia, agnosia, apraxia or anomia. Speech is clear with normal prosody and enunciation. Thought process is linear. Mood is normal and affect is normal.  Cranial nerves II - XII are as described above under HEENT exam. In addition: shoulder shrug is normal with equal shoulder height noted. Motor exam: Normal bulk, strength and tone is noted. There is no drift, tremor or rebound. Romberg is negative. Fine motor skills and coordination: intact with normal finger taps, normal hand movements, normal rapid alternating patting, normal foot taps and normal foot agility.  Cerebellar testing: No dysmetria or intention tremor on finger to nose testing. Heel to shin is unremarkable bilaterally. There is no truncal or gait ataxia.  Sensory exam: intact to light touch in the upper and lower extremities.  Gait, station and balance:  He stands easily. No veering to one side is noted. No leaning to one side is noted. Posture is age-appropriate and stance is narrow based. Gait shows normal stride length and normal pace. No problems turning are noted. Tandem walk is unremarkable.   Assessment and plan:  In summary, Mark Barrett is a very pleasant 37 y.o.-year old male with an underlying medical history of prediabetes, hyperlipidemia, vitamin D deficiency, and obesity, who presents for evaluation of his sleep disturbance with his history and physical examination concerning for underlying obstructive sleep apnea (OSA). I had a long chat with the patient about my findings and the diagnosis of OSA, its prognosis and treatment options. We talked about medical treatments, surgical interventions and non-pharmacological approaches. I explained in particular the risks and ramifications of untreated moderate to severe OSA, especially with respect to developing cardiovascular disease down the Road, including congestive heart failure, difficult to treat hypertension, cardiac arrhythmias, or stroke. Even type 2 diabetes has, in part, been linked to untreated OSA. Symptoms of untreated OSA include daytime sleepiness, memory problems, mood irritability and mood disorder such as depression and anxiety, lack of energy, as well as recurrent headaches, especially morning headaches. We talked about trying to maintain a healthy lifestyle in general, as well as the importance of weight control. I encouraged the patient to eat healthy, exercise daily and keep well hydrated, to keep a scheduled bedtime and wake time routine, to not skip any meals and eat healthy snacks in between meals. I advised the patient not to drive when feeling sleepy. I recommended the following at this time: sleep study with potential positive airway pressure titration. (We will score hypopneas at 4%).   I explained the sleep test procedure to the patient and also outlined possible surgical  and non-surgical treatment options of OSA, including the use of a custom-made dental device (which would require a referral to a specialist dentist or oral surgeon), upper airway surgical options, such as pillar implants, radiofrequency surgery, tongue base surgery, and UPPP (which would involve a referral to an ENT surgeon). Rarely, jaw surgery such as mandibular advancement may be considered.  I also explained the CPAP treatment option to the patient, who indicated that he would be willing to try CPAP if the need arises.  I explained the importance of being compliant with PAP treatment, not only for insurance purposes but primarily to improve His symptoms, and for the patient's long term health benefit, including to reduce His cardiovascular risks. I answered all his questions today and the patient was in agreement. I plan to see him back after the sleep study is completed and encouraged him to call with any interim questions, concerns, problems or updates.   Thank you very much for allowing me to participate in the care of this nice patient. If I can be of any further assistance to you please do not hesitate to call me at [phone].  Sincerely,   Mark FoleySaima Isaly Fasching, MD, [name]

## 2018-06-18 ENCOUNTER — Ambulatory Visit (INDEPENDENT_AMBULATORY_CARE_PROVIDER_SITE_OTHER): Payer: 59 | Admitting: Family Medicine

## 2018-06-18 ENCOUNTER — Encounter (INDEPENDENT_AMBULATORY_CARE_PROVIDER_SITE_OTHER): Payer: Self-pay | Admitting: Family Medicine

## 2018-06-18 VITALS — BP 133/80 | HR 72 | Temp 98.5°F | Ht 76.0 in | Wt 316.0 lb

## 2018-06-18 DIAGNOSIS — E559 Vitamin D deficiency, unspecified: Secondary | ICD-10-CM

## 2018-06-18 DIAGNOSIS — R7303 Prediabetes: Secondary | ICD-10-CM | POA: Diagnosis not present

## 2018-06-18 DIAGNOSIS — Z9189 Other specified personal risk factors, not elsewhere classified: Secondary | ICD-10-CM

## 2018-06-18 DIAGNOSIS — Z6838 Body mass index (BMI) 38.0-38.9, adult: Secondary | ICD-10-CM

## 2018-06-18 MED ORDER — VITAMIN D (ERGOCALCIFEROL) 1.25 MG (50000 UNIT) PO CAPS: 50000.0000 [IU] | ORAL_CAPSULE | ORAL | 0 refills | Status: DC

## 2018-06-24 ENCOUNTER — Encounter (INDEPENDENT_AMBULATORY_CARE_PROVIDER_SITE_OTHER): Payer: Self-pay | Admitting: Family Medicine

## 2018-06-24 NOTE — Progress Notes (Signed)
Office: [phone]  /  Fax: [phone]   HPI:   Chief Complaint: OBESITY Mark Barrett is here to discuss his progress with his obesity treatment plan. He is on the Category 4 plan and is following his eating plan approximately 50 to 60% of the time. He states he is exercising 0 minutes 0 times per week. Mark Barrett is currently struggling with meal prep given his significant travel for work. He does report hunger if he does not eat all of his food. He has rearranged his food to not have as much food at dinner. He has been trying to make smarter choices when eating out.  His weight is (!) 316 lb (143.3 kg) today and has had a weight loss of 2 pounds over a period of 2 weeks since his last visit. He has lost 4 lbs since starting treatment with us.  Vitamin D deficiency Mark Barrett has a diagnosis of vitamin D deficiency. Mark Barrett is currently taking vit D and he admits to fatigue but denies nausea, vomiting or muscle weakness.  At risk for osteopenia and osteoporosis Mark Barrett is at higher risk of osteopenia and osteoporosis due to vitamin D deficiency.   Pre-Diabetes Mark Barrett has a diagnosis of prediabetes based on his elevated HgA1c of 5.8 and insulin level of 17.3. He was informed this puts him at greater risk of developing diabetes. He is not taking metformin currently and continues to work on diet and exercise to decrease risk of diabetes. He denies nausea or hypoglycemia.  ALLERGIES: No Known Allergies  MEDICATIONS: Current Outpatient Medications on File Prior to Visit  Medication Sig Dispense Refill  . Ascorbic Acid (VITAMIN C) 100 MG tablet Take 250 mg by mouth daily.    . cetirizine (ZYRTEC) 10 MG tablet Take 10 mg by mouth daily.    Marland Kitchen. ibuprofen (ADVIL,MOTRIN) 400 MG tablet Take 400 mg by mouth every 6 (six) hours as needed.    . sildenafil (VIAGRA) 25 MG tablet Take 25 mg by mouth daily as needed for erectile dysfunction.    . SUMAtriptan (IMITREX) 50 MG tablet Take 50 mg by mouth every 2 (two) hours  as needed for migraine. May repeat in 2 hours if headache persists or recurs.    . Topiramate (TOPAMAX PO) Take 100 mg by mouth 2 (two) times daily.      No current facility-administered medications on file prior to visit.     PAST MEDICAL HISTORY: Past Medical History:  Diagnosis Date  . Back pain   . Depression   . Diarrhea   . Erectile dysfunction   . GERD (gastroesophageal reflux disease)   . Joint pain   . Migraines   . Seasonal allergies     PAST SURGICAL HISTORY: Past Surgical History:  Procedure Laterality Date  . ANKLE SURGERY    . WISDOM TOOTH EXTRACTION  2008    SOCIAL HISTORY: Social History   Tobacco Use  . Smoking status: Never Smoker  . Smokeless tobacco: Never Used  Substance Use Topics  . Alcohol use: No  . Drug use: Not on file    FAMILY HISTORY: Family History  Problem Relation Age of Onset  . Hypertension Mother   . Hyperlipidemia Mother   . Obesity Mother   . Heart disease Father   . Cancer Father   . Sleep apnea Father     ROS: Review of Systems  Constitutional: Positive for malaise/fatigue and weight loss.  Gastrointestinal: Negative for nausea and vomiting.  Musculoskeletal:  Negative for muscle weakness   Endo/Heme/Allergies:       Negative for hypoglycemia    PHYSICAL EXAM: Blood pressure 133/80, pulse 72, temperature 98.5 F (36.9 C), temperature source Oral, height 6\' 4"  (1.93 m), weight (!) 316 lb (143.3 kg), SpO2 97 %. Body mass index is 38.46 kg/m. Physical Exam  Constitutional: He is oriented to person, place, and time. He appears well-developed and well-nourished.  Cardiovascular: Normal rate.  Pulmonary/Chest: Effort normal.  Musculoskeletal: Normal range of motion.  Neurological: He is oriented to person, place, and time.  Skin: Skin is warm and dry.  Psychiatric: He has a normal mood and affect. His behavior is normal.  Vitals reviewed.   RECENT LABS AND TESTS: BMET    Component Value Date/Time    NA 141 05/19/2018 1223   K 4.4 05/19/2018 1223   CL 104 05/19/2018 1223   CO2 20 05/19/2018 1223   GLUCOSE 94 05/19/2018 1223   BUN 23 (H) 05/19/2018 1223   CREATININE 1.00 05/19/2018 1223   CALCIUM 9.5 05/19/2018 1223   GFRNONAA 96 05/19/2018 1223   GFRAA 111 05/19/2018 1223   Lab Results  Component Value Date   HGBA1C 5.8 (H) 05/19/2018   Lab Results  Component Value Date   INSULIN 17.3 05/19/2018   CBC    Component Value Date/Time   WBC 6.8 05/19/2018 1223   RBC 5.12 05/19/2018 1223   HGB 15.2 05/19/2018 1223   HCT 44.2 05/19/2018 1223   MCV 86 05/19/2018 1223   MCH 29.7 05/19/2018 1223   MCHC 34.4 05/19/2018 1223   RDW 12.8 05/19/2018 1223   LYMPHSABS 1.5 05/19/2018 1223   EOSABS 0.1 05/19/2018 1223   BASOSABS 0.1 05/19/2018 1223   Iron/TIBC/Ferritin/ %Sat No results found for: IRON, TIBC, FERRITIN, IRONPCTSAT Lipid Panel     Component Value Date/Time   CHOL 245 (H) 05/19/2018 1223   TRIG 162 (H) 05/19/2018 1223   HDL 40 05/19/2018 1223   LDLCALC 173 (H) 05/19/2018 1223   Hepatic Function Panel     Component Value Date/Time   PROT 7.4 05/19/2018 1223   ALBUMIN 4.8 05/19/2018 1223   AST 32 05/19/2018 1223   ALT 71 (H) 05/19/2018 1223   ALKPHOS 92 05/19/2018 1223   BILITOT 0.3 05/19/2018 1223      Component Value Date/Time   TSH 3.910 05/19/2018 1223    Ref. Range 05/19/2018 12:23  Vitamin D, 25-Hydroxy Latest Ref Range: 30.0 - 100.0 ng/mL 20.2 (L)     ASSESSMENT AND PLAN: Vitamin D deficiency - Plan: Vitamin D, Ergocalciferol, (DRISDOL) 1.25 MG (50000 UT) CAPS capsule  Pre-diabetes  At risk for osteoporosis  Class 2 severe obesity with serious comorbidity and body mass index (BMI) of 38.0 to 38.9 in adult, unspecified obesity type (HCC)  PLAN:  Vitamin D Deficiency Mark Barrett was informed that low vitamin D levels contributes to fatigue and are associated with obesity, breast, and colon cancer. He agrees to continue to take prescription Vit D  @50 ,000 IU every week #4 with no refills and will follow up for routine testing of vitamin D, at least 2-3 times per year. He was informed of the risk of over-replacement of vitamin D and agrees to not increase his dose unless he discusses this with [place] first. Mark Barrett agrees to follow up with our clinic in 2 weeks.  At risk for osteopenia and osteoporosis Mark Barrett was given extended  (15 minutes) osteoporosis prevention counseling today. Mark Barrett is at risk for osteopenia and osteoporosis  due to his vitamin D deficiency. He was encouraged to take his vitamin D and follow his higher calcium diet and increase strengthening exercise to help strengthen his bones and decrease his risk of osteopenia and osteoporosis.  Pre-Diabetes Mark Barrett will continue to work on weight loss, exercise, and decreasing simple carbohydrates in his diet to help decrease the risk of diabetes. He was informed that eating too many simple carbohydrates or too many calories at one sitting increases the likelihood of GI side effects. We will repeat labs in two months and Mark Barrett agreed to follow up with [place] as directed to monitor his progress.  Obesity Mark Barrett is currently in the action stage of change. As such, his goal is to continue with weight loss efforts He has agreed to follow the Category 4 plan Mark Barrett has been instructed to work up to a goal of 150 minutes of combined cardio and strengthening exercise per week for weight loss and overall health benefits. We discussed the following Behavioral Modification Strategies today: better snacking choices, planning for success, work on meal planning and easy cooking plans and holiday eating strategies   Mark Barrett has agreed to follow up with our clinic in 2 weeks. He was informed of the importance of frequent follow up visits to maximize his success with intensive lifestyle modifications for his multiple health conditions.   OBESITY BEHAVIORAL INTERVENTION VISIT  Today's visit was # 3   Starting  weight: 320 lbs Starting date: 05/19/2018 Today's weight : 316 lbs Today's date: 06/18/2018 Total lbs lost to date: 4    ASK: We discussed the diagnosis of obesity with Mark Barrett today and Mark Barrett agreed to give [place] permission to discuss obesity behavioral modification therapy today.  ASSESS: Mark Barrett has the diagnosis of obesity and his BMI today is 38.48 Mark Barrett is in the action stage of change   ADVISE: Mark Barrett was educated on the multiple health risks of obesity as well as the benefit of weight loss to improve his health. He was advised of the need for long term treatment and the importance of lifestyle modifications to improve his current health and to decrease his risk of future health problems.  AGREE: Multiple dietary modification options and treatment options were discussed and  Mark Barrett agreed to follow the recommendations documented in the above note.  ARRANGE: Mark Barrett was educated on the importance of frequent visits to treat obesity as outlined per CMS and USPSTF guidelines and agreed to schedule his next follow up appointment today.  I, Mark Barrett, am acting as transcriptionist for Mark Schilder, MD  I have reviewed the above documentation for accuracy and completeness, and I agree with the above. - Debbra Riding, MD

## 2018-07-02 ENCOUNTER — Ambulatory Visit (INDEPENDENT_AMBULATORY_CARE_PROVIDER_SITE_OTHER): Payer: 59 | Admitting: Neurology

## 2018-07-02 DIAGNOSIS — Z9189 Other specified personal risk factors, not elsewhere classified: Secondary | ICD-10-CM

## 2018-07-02 DIAGNOSIS — G478 Other sleep disorders: Secondary | ICD-10-CM

## 2018-07-02 DIAGNOSIS — R0683 Snoring: Secondary | ICD-10-CM

## 2018-07-02 DIAGNOSIS — E669 Obesity, unspecified: Secondary | ICD-10-CM

## 2018-07-02 DIAGNOSIS — R0681 Apnea, not elsewhere classified: Secondary | ICD-10-CM

## 2018-07-02 DIAGNOSIS — G4733 Obstructive sleep apnea (adult) (pediatric): Secondary | ICD-10-CM

## 2018-07-02 DIAGNOSIS — Z820 Family history of epilepsy and other diseases of the nervous system: Secondary | ICD-10-CM

## 2018-07-02 DIAGNOSIS — G4719 Other hypersomnia: Secondary | ICD-10-CM

## 2018-07-02 DIAGNOSIS — R51 Headache: Secondary | ICD-10-CM

## 2018-07-02 DIAGNOSIS — R519 Headache, unspecified: Secondary | ICD-10-CM

## 2018-07-07 ENCOUNTER — Ambulatory Visit (INDEPENDENT_AMBULATORY_CARE_PROVIDER_SITE_OTHER): Payer: Self-pay | Admitting: Family Medicine

## 2018-07-07 ENCOUNTER — Encounter (INDEPENDENT_AMBULATORY_CARE_PROVIDER_SITE_OTHER): Payer: Self-pay

## 2018-07-17 ENCOUNTER — Telehealth: Payer: Self-pay

## 2018-07-17 NOTE — Telephone Encounter (Signed)
-----   Message from Huston FoleySaima Athar, MD sent at 07/17/2018  3:14 PM EST ----- Patient referred by Dr. Rinaldo RatelKadolph, seen by me on 06/10/18, HST on 07/02/18.    Please call and notify the patient that the recent home sleep test showed obstructive sleep apnea in the moderate to severe range. While I recommend treatment for this in the form CPAP, his insurance will not approve a sleep study for this. They will likely only approve a trial of autoPAP, which means, that we don't have to bring him in for a sleep study with CPAP, but will let him try an autoPAP machine at home, through a DME company (of his choice, or as per insurance requirement). The DME representative will educate him on how to use the machine, how to put the mask on, etc. I have placed an order in the chart. Please send referral, talk to patient, send report to referring MD. We will need a FU in sleep clinic for 10 weeks post-PAP set up, please arrange that with me or one of our NPs.  I would also like to do an ONO about 1 week post set up with autoPAP. Please advise patient and send the order for ONO too. Janene Harveysa

## 2018-07-17 NOTE — Addendum Note (Signed)
Addended by: Huston FoleyATHAR, Aleathia Purdy on: 07/17/2018 03:14 PM   Modules accepted: Orders

## 2018-07-17 NOTE — Procedures (Signed)
[facility] [address] [place] [address] NAME:  Mark Barrett                                                                              DOB: 08/31/1980 MEDICAL RECORD no:   [id number]                                                      DOS: 07/02/2018 REFERRING PHYSICIAN: Debbra RidingAlexandria Kadolph, MD STUDY PERFORMED: Home Sleep Test on Watch Pat HISTORY: 37 year old right-handed man with a history of prediabetes, hyperlipidemia, vitamin D deficiency, and obesity, who reports snoring, nonrestorative sleep and excessive daytime somnolence. His Epworth sleepiness score is 6 out of 24, BMI of 38.   STUDY RESULTS:   Total Recording Time: 7 hrs, 58 mins; Total Sleep Time: 7 hrs, 15 mins Total Apnea/Hypopnea Index (AHI): 29.7/h; RDI: 33.1/h; REM AHI: 23.0/h Average Oxygen Saturation:  95%; Lowest Oxygen Desaturation: 80%  Total Time Oxygen Saturation Below or at 88%:  6.4 minutes  Average Heart Rate: 55 bpm  IMPRESSION: OSA RECOMMENDATION: This home sleep test demonstrates overall moderate obstructive sleep apnea with a total AHI of 29.7/hour and O2 nadir of 80%. Given the patient's medical history and sleep related complaints, treatment with positive airway pressure (in the form of CPAP) is recommended. This will require a full night CPAP titration study for proper treatment settings, O2 monitoring and mask fitting. Based on the moderate severity of his sleep disordered breathing, an attended titration study is indicated. However, patient's insurance has denied an attended sleep study; therefore, the patient will be advised to proceed with an autoPAP titration/trial at home for now. I will also request an overnight pulse oximetry test, once patient is established on autoPAP at home. Please note, that untreated obstructive sleep apnea may carry additional perioperative morbidity. Patients with significant obstructive sleep apnea should receive perioperative PAP  therapy and the surgeons and particularly the anesthesiologist should be informed of the diagnosis and the severity of the sleep disordered breathing. The patient should be cautioned not to drive, work at heights, or operate dangerous or heavy equipment when tired or sleepy. Review and reiteration of good sleep hygiene measures should be pursued with any patient. Other causes of the patient's symptoms, including circadian rhythm disturbances, an underlying mood disorder, medication effect and/or an underlying medical problem cannot be ruled out based on this test. Clinical correlation is recommended. The patient and his referring provider will be notified of the test results. The patient [facility]. I certify that I have reviewed the raw data recording prior to the issuance of this report in accordance with the standards of the American Academy of Sleep Medicine (AASM).  Huston FoleySaima Addis Tuohy, MD, [name] [facility] Diplomat, ABPN (Neurology and Sleep)

## 2018-07-17 NOTE — Telephone Encounter (Signed)
I called pt and discussed his sleep study results with him. Pt says that he doesn't think a cpap will "work effectively" for him. He is asking if he can try alternative treatments first. I advised him that I will speak with Dr. Frances FurbishAthar on Monday. Pt verbalized understanding of results.

## 2018-07-17 NOTE — Progress Notes (Signed)
Patient referred by Dr. Rinaldo RatelKadolph, seen by me on 06/10/18, HST on 07/02/18.    Please call and notify the patient that the recent home sleep test showed obstructive sleep apnea in the moderate to severe range. While I recommend treatment for this in the form CPAP, his insurance will not approve a sleep study for this. They will likely only approve a trial of autoPAP, which means, that we don't have to bring him in for a sleep study with CPAP, but will let him try an autoPAP machine at home, through a DME company (of his choice, or as per insurance requirement). The DME representative will educate him on how to use the machine, how to put the mask on, etc. I have placed an order in the chart. Please send referral, talk to patient, send report to referring MD. We will need a FU in sleep clinic for 10 weeks post-PAP set up, please arrange that with me or one of our NPs.  I would also like to do an ONO about 1 week post set up with autoPAP. Please advise patient and send the order for ONO too. Janene Harveysa

## 2018-07-21 NOTE — Telephone Encounter (Signed)
I called pt to discus. No answer, left a message asking him to call me back.

## 2018-07-21 NOTE — Telephone Encounter (Signed)
Pt returned my call. I advised him of Dr. Teofilo PodAthar's recommendations. He will call his dentist and let us know if he needs a referral to another dentist or if he decides to pursue cpap. Pt verbalized understanding. Pt had no questions at this time but was encouraged to call back if questions arise.

## 2018-07-21 NOTE — Telephone Encounter (Signed)
Please remind patient, that the most effective and quickest treatment for moderate to severe OSA is in the form of CPAP or autoPAP. If he wishes to pursue treatment with an oral appliance, he can talk to his dentist, or, if needed, we can [facility] a [facility], Dr. Toni ArthursFuller or Dr. Myrtis SerKatz, all in the area here. Please call patient back and put in referral if desired.

## 2023-09-09 ENCOUNTER — Other Ambulatory Visit: Payer: Self-pay

## 2023-09-09 ENCOUNTER — Encounter (HOSPITAL_BASED_OUTPATIENT_CLINIC_OR_DEPARTMENT_OTHER): Payer: Self-pay

## 2023-09-09 ENCOUNTER — Inpatient Hospital Stay (HOSPITAL_BASED_OUTPATIENT_CLINIC_OR_DEPARTMENT_OTHER)
Admission: EM | Admit: 2023-09-09 | Discharge: 2023-09-10 | DRG: 321 | Disposition: A | Discharge status: Home / Self Care | Payer: 59 | Attending: Cardiology | Admitting: Cardiology

## 2023-09-09 ENCOUNTER — Telehealth (HOSPITAL_COMMUNITY): Payer: Self-pay | Admitting: Pharmacy Technician

## 2023-09-09 ENCOUNTER — Encounter (HOSPITAL_COMMUNITY)
Admission: EM | Disposition: A | Discharge status: Home / Self Care | Payer: Self-pay | Source: Home / Self Care | Attending: Cardiology

## 2023-09-09 ENCOUNTER — Emergency Department (HOSPITAL_BASED_OUTPATIENT_CLINIC_OR_DEPARTMENT_OTHER): Payer: 59

## 2023-09-09 ENCOUNTER — Other Ambulatory Visit (HOSPITAL_COMMUNITY): Payer: Self-pay

## 2023-09-09 DIAGNOSIS — I2510 Atherosclerotic heart disease of native coronary artery without angina pectoris: Secondary | ICD-10-CM | POA: Diagnosis present

## 2023-09-09 DIAGNOSIS — E785 Hyperlipidemia, unspecified: Secondary | ICD-10-CM | POA: Insufficient documentation

## 2023-09-09 DIAGNOSIS — K219 Gastro-esophageal reflux disease without esophagitis: Secondary | ICD-10-CM | POA: Diagnosis present

## 2023-09-09 DIAGNOSIS — G4733 Obstructive sleep apnea (adult) (pediatric): Secondary | ICD-10-CM | POA: Diagnosis present

## 2023-09-09 DIAGNOSIS — I10 Essential (primary) hypertension: Secondary | ICD-10-CM | POA: Insufficient documentation

## 2023-09-09 DIAGNOSIS — I110 Hypertensive heart disease with heart failure: Secondary | ICD-10-CM | POA: Diagnosis present

## 2023-09-09 DIAGNOSIS — Z6841 Body Mass Index (BMI) 40.0 and over, adult: Secondary | ICD-10-CM

## 2023-09-09 DIAGNOSIS — I2102 ST elevation (STEMI) myocardial infarction involving left anterior descending coronary artery: Secondary | ICD-10-CM

## 2023-09-09 DIAGNOSIS — Z955 Presence of coronary angioplasty implant and graft: Secondary | ICD-10-CM

## 2023-09-09 DIAGNOSIS — N529 Male erectile dysfunction, unspecified: Secondary | ICD-10-CM | POA: Diagnosis present

## 2023-09-09 DIAGNOSIS — I2109 ST elevation (STEMI) myocardial infarction involving other coronary artery of anterior wall: Principal | ICD-10-CM | POA: Diagnosis present

## 2023-09-09 DIAGNOSIS — Z8249 Family history of ischemic heart disease and other diseases of the circulatory system: Secondary | ICD-10-CM

## 2023-09-09 DIAGNOSIS — J302 Other seasonal allergic rhinitis: Secondary | ICD-10-CM | POA: Diagnosis present

## 2023-09-09 DIAGNOSIS — I213 ST elevation (STEMI) myocardial infarction of unspecified site: Secondary | ICD-10-CM | POA: Diagnosis present

## 2023-09-09 DIAGNOSIS — I5021 Acute systolic (congestive) heart failure: Secondary | ICD-10-CM | POA: Diagnosis present

## 2023-09-09 DIAGNOSIS — Z79899 Other long term (current) drug therapy: Secondary | ICD-10-CM | POA: Diagnosis not present

## 2023-09-09 DIAGNOSIS — E7800 Pure hypercholesterolemia, unspecified: Secondary | ICD-10-CM | POA: Diagnosis present

## 2023-09-09 DIAGNOSIS — G43909 Migraine, unspecified, not intractable, without status migrainosus: Secondary | ICD-10-CM | POA: Diagnosis present

## 2023-09-09 DIAGNOSIS — E872 Acidosis, unspecified: Secondary | ICD-10-CM | POA: Diagnosis present

## 2023-09-09 HISTORY — PX: LEFT HEART CATH AND CORONARY ANGIOGRAPHY: CATH118249

## 2023-09-09 HISTORY — PX: CORONARY/GRAFT ACUTE MI REVASCULARIZATION: CATH118305

## 2023-09-09 HISTORY — PX: RIGHT HEART CATH: CATH118263

## 2023-09-09 LAB — POCT I-STAT 7, (LYTES, BLD GAS, ICA,H+H)
Acid-base deficit: 2 mmol/L (ref 0.0–2.0)
Bicarbonate: 22.9 mmol/L (ref 20.0–28.0)
Calcium, Ion: 1.19 mmol/L (ref 1.15–1.40)
HCT: 43 % (ref 39.0–52.0)
Hemoglobin: 14.6 g/dL (ref 13.0–17.0)
O2 Saturation: 95 %
Potassium: 3.8 mmol/L (ref 3.5–5.1)
Sodium: 139 mmol/L (ref 135–145)
TCO2: 24 mmol/L (ref 22–32)
pCO2 arterial: 38.4 mm[Hg] (ref 32–48)
pH, Arterial: 7.383 (ref 7.35–7.45)
pO2, Arterial: 76 mm[Hg] — ABNORMAL LOW (ref 83–108)

## 2023-09-09 LAB — CBC
HCT: 46.1 % (ref 39.0–52.0)
Hemoglobin: 15.9 g/dL (ref 13.0–17.0)
MCH: 30.1 pg (ref 26.0–34.0)
MCHC: 34.5 g/dL (ref 30.0–36.0)
MCV: 87.1 fL (ref 80.0–100.0)
Platelets: 309 10*3/uL (ref 150–400)
RBC: 5.29 MIL/uL (ref 4.22–5.81)
RDW: 12.5 % (ref 11.5–15.5)
WBC: 10.8 10*3/uL — ABNORMAL HIGH (ref 4.0–10.5)
nRBC: 0 % (ref 0.0–0.2)

## 2023-09-09 LAB — LACTIC ACID, PLASMA: Lactic Acid, Venous: 3.7 mmol/L (ref 0.5–1.9)

## 2023-09-09 LAB — POCT I-STAT EG7
Acid-Base Excess: 1 mmol/L (ref 0.0–2.0)
Bicarbonate: 26.4 mmol/L (ref 20.0–28.0)
Calcium, Ion: 1.19 mmol/L (ref 1.15–1.40)
HCT: 41 % (ref 39.0–52.0)
Hemoglobin: 13.9 g/dL (ref 13.0–17.0)
O2 Saturation: 61 %
Potassium: 3.7 mmol/L (ref 3.5–5.1)
Sodium: 140 mmol/L (ref 135–145)
TCO2: 28 mmol/L (ref 22–32)
pCO2, Ven: 44.3 mm[Hg] (ref 44–60)
pH, Ven: 7.383 (ref 7.25–7.43)
pO2, Ven: 32 mm[Hg] (ref 32–45)

## 2023-09-09 LAB — TROPONIN I (HIGH SENSITIVITY)
Troponin I (High Sensitivity): 4 ng/L (ref <18)
Troponin I (High Sensitivity): >24000 ng/L (ref <18)

## 2023-09-09 LAB — CG4 I-STAT (LACTIC ACID)
Lactic Acid, Venous: 2.1 mmol/L (ref 0.5–1.9)
Lactic Acid, Venous: 3.7 mmol/L (ref 0.5–1.9)

## 2023-09-09 LAB — BASIC METABOLIC PANEL
Anion gap: 14 (ref 5–15)
BUN: 20 mg/dL (ref 6–20)
CO2: 21 mmol/L — ABNORMAL LOW (ref 22–32)
Calcium: 9.4 mg/dL (ref 8.9–10.3)
Chloride: 103 mmol/L (ref 98–111)
Creatinine, Ser: 0.86 mg/dL (ref 0.61–1.24)
GFR, Estimated: >60 mL/min (ref >60)
Glucose, Bld: 122 mg/dL — ABNORMAL HIGH (ref 70–99)
Potassium: 3.4 mmol/L — ABNORMAL LOW (ref 3.5–5.1)
Sodium: 138 mmol/L (ref 135–145)

## 2023-09-09 LAB — POCT ACTIVATED CLOTTING TIME
Activated Clotting Time: 176 s
Activated Clotting Time: 216 s
Activated Clotting Time: 216 s
Activated Clotting Time: 227 s

## 2023-09-09 SURGERY — LEFT HEART CATH AND CORONARY ANGIOGRAPHY
Anesthesia: LOCAL

## 2023-09-09 MED ORDER — MIDAZOLAM HCL 2 MG/2ML IJ SOLN
INTRAMUSCULAR | Status: AC
  Filled 2023-09-09: qty 2

## 2023-09-09 MED ORDER — VERAPAMIL HCL 2.5 MG/ML IV SOLN
INTRAVENOUS | Status: AC
  Filled 2023-09-09: qty 2

## 2023-09-09 MED ORDER — ONDANSETRON HCL 4 MG/2ML IJ SOLN: 4.0000 mg | Freq: Four times a day (QID) | INTRAMUSCULAR | Status: DC | PRN

## 2023-09-09 MED ORDER — PRASUGREL HCL 10 MG PO TABS
10.0000 mg | ORAL_TABLET | Freq: Every day | ORAL | Status: DC
  Administered 2023-09-10: 10 mg via ORAL
  Filled 2023-09-09: qty 1

## 2023-09-09 MED ORDER — HEPARIN SODIUM (PORCINE) 1000 UNIT/ML IJ SOLN
INTRAMUSCULAR | Status: DC | PRN
  Administered 2023-09-09: 4000 [IU] via INTRAVENOUS
  Administered 2023-09-09: 8000 [IU] via INTRAVENOUS
  Administered 2023-09-09: 4000 [IU] via INTRAVENOUS

## 2023-09-09 MED ORDER — ASPIRIN 81 MG PO CHEW
324.0000 mg | CHEWABLE_TABLET | Freq: Once | ORAL | Status: AC
  Administered 2023-09-09: 324 mg via ORAL
  Filled 2023-09-09: qty 4

## 2023-09-09 MED ORDER — SODIUM CHLORIDE 0.9 % IV SOLN
250.0000 mL | INTRAVENOUS | Status: AC | PRN
Start: 2023-09-09 — End: 2023-09-10

## 2023-09-09 MED ORDER — ATORVASTATIN CALCIUM 80 MG PO TABS
80.0000 mg | ORAL_TABLET | Freq: Every day | ORAL | Status: DC
  Administered 2023-09-10: 80 mg via ORAL
  Filled 2023-09-09: qty 1

## 2023-09-09 MED ORDER — MIDAZOLAM HCL 2 MG/2ML IJ SOLN
INTRAMUSCULAR | Status: DC | PRN
  Administered 2023-09-09: 2 mg via INTRAVENOUS

## 2023-09-09 MED ORDER — HEPARIN SODIUM (PORCINE) 1000 UNIT/ML IJ SOLN
INTRAMUSCULAR | Status: AC
  Filled 2023-09-09: qty 10

## 2023-09-09 MED ORDER — NITROGLYCERIN 1 MG/10 ML FOR IR/CATH LAB
INTRA_ARTERIAL | Status: AC
  Filled 2023-09-09: qty 10

## 2023-09-09 MED ORDER — METOPROLOL SUCCINATE ER 50 MG PO TB24: 50.0000 mg | ORAL_TABLET | Freq: Every day | ORAL | Status: DC

## 2023-09-09 MED ORDER — HEPARIN (PORCINE) 25000 UT/250ML-% IV SOLN
1500.0000 [IU]/h | INTRAVENOUS | Status: DC
  Administered 2023-09-09: 1500 [IU]/h via INTRAVENOUS

## 2023-09-09 MED ORDER — NITROGLYCERIN 0.4 MG SL SUBL
0.4000 mg | SUBLINGUAL_TABLET | SUBLINGUAL | Status: DC | PRN
  Administered 2023-09-09 (×2): 0.4 mg via SUBLINGUAL
  Filled 2023-09-09: qty 1

## 2023-09-09 MED ORDER — PRASUGREL HCL 10 MG PO TABS
ORAL_TABLET | ORAL | Status: DC | PRN
Start: 2023-09-09 — End: 2023-09-09
  Administered 2023-09-09: 60 mg via ORAL

## 2023-09-09 MED ORDER — NITROGLYCERIN 1 MG/10 ML FOR IR/CATH LAB
INTRA_ARTERIAL | Status: DC | PRN
  Administered 2023-09-09: 200 ug via INTRACORONARY

## 2023-09-09 MED ORDER — SODIUM CHLORIDE 0.9% FLUSH
3.0000 mL | INTRAVENOUS | Status: DC | PRN
Start: 2023-09-09 — End: 2023-09-10

## 2023-09-09 MED ORDER — ASPIRIN 81 MG PO CHEW
81.0000 mg | CHEWABLE_TABLET | Freq: Every day | ORAL | Status: DC
  Administered 2023-09-10: 81 mg via ORAL
  Filled 2023-09-09: qty 1

## 2023-09-09 MED ORDER — HEPARIN (PORCINE) 25000 UT/250ML-% IV SOLN
INTRAVENOUS | Status: AC
Start: 2023-09-09 — End: 2023-09-09
  Filled 2023-09-09: qty 250

## 2023-09-09 MED ORDER — IOHEXOL 350 MG/ML SOLN
INTRAVENOUS | Status: DC | PRN
  Administered 2023-09-09: 110 mL

## 2023-09-09 MED ORDER — PRASUGREL HCL 10 MG PO TABS
ORAL_TABLET | ORAL | Status: AC
  Filled 2023-09-09: qty 6

## 2023-09-09 MED ORDER — ACETAMINOPHEN 325 MG PO TABS
650.0000 mg | ORAL_TABLET | ORAL | Status: DC | PRN
  Administered 2023-09-09: 650 mg via ORAL
  Filled 2023-09-09: qty 2

## 2023-09-09 MED ORDER — NITROGLYCERIN IN D5W 200-5 MCG/ML-% IV SOLN
0.0000 ug/min | INTRAVENOUS | Status: DC
  Administered 2023-09-09: 5 ug/min via INTRAVENOUS
  Filled 2023-09-09: qty 250

## 2023-09-09 MED ORDER — LOSARTAN POTASSIUM 50 MG PO TABS
50.0000 mg | ORAL_TABLET | Freq: Every evening | ORAL | Status: DC
  Administered 2023-09-09: 50 mg via ORAL
  Filled 2023-09-09: qty 1

## 2023-09-09 MED ORDER — HEPARIN (PORCINE) IN NACL 1000-0.9 UT/500ML-% IV SOLN
INTRAVENOUS | Status: DC | PRN
  Administered 2023-09-09 (×2): 500 mL

## 2023-09-09 MED ORDER — FENTANYL CITRATE (PF) 100 MCG/2ML IJ SOLN
INTRAMUSCULAR | Status: DC | PRN
  Administered 2023-09-09: 50 ug via INTRAVENOUS

## 2023-09-09 MED ORDER — LIDOCAINE HCL (PF) 1 % IJ SOLN
INTRAMUSCULAR | Status: DC | PRN
  Administered 2023-09-09: 2 mL

## 2023-09-09 MED ORDER — FENTANYL CITRATE (PF) 100 MCG/2ML IJ SOLN
INTRAMUSCULAR | Status: AC
  Filled 2023-09-09: qty 2

## 2023-09-09 MED ORDER — HEPARIN SODIUM (PORCINE) 5000 UNIT/ML IJ SOLN
4000.0000 [IU] | Freq: Once | INTRAMUSCULAR | Status: AC
  Administered 2023-09-09: 4000 [IU] via INTRAVENOUS
  Filled 2023-09-09: qty 1

## 2023-09-09 MED ORDER — VERAPAMIL HCL 2.5 MG/ML IV SOLN
INTRAVENOUS | Status: DC | PRN
  Administered 2023-09-09: 10 mL via INTRA_ARTERIAL

## 2023-09-09 MED ORDER — SODIUM CHLORIDE 0.9 % IV SOLN: INTRAVENOUS | Status: DC

## 2023-09-09 SURGICAL SUPPLY — 16 items
BALLN EMERGE MR 2.5X15 (BALLOONS) ×1 IMPLANT
CATH BALLN WEDGE 5F 110CM (CATHETERS) ×1
CATH INFINITI AMBI 5FR TG (CATHETERS) ×1
CATH VISTA GUIDE 6FR XB3.5 (CATHETERS) ×1
DEVICE RAD COMP TR BAND LRG (VASCULAR PRODUCTS) ×1
GLIDESHEATH SLEND SS 6F .021 (SHEATH) ×1
INQWIRE 1.5J .035X260CM (WIRE) ×2 IMPLANT
KIT HEMO VALVE WATCHDOG (MISCELLANEOUS) ×1
KIT MICROPUNCTURE NIT STIFF (SHEATH) ×1
PACK CARDIAC CATHETERIZATION (CUSTOM PROCEDURE TRAY) ×1
SET ATX-X65L (MISCELLANEOUS) ×1
SHEATH GLIDE SLENDER 4/5FR (SHEATH) ×1
SHEATH PINNACLE 7F 10CM (SHEATH)
SYNERGY XD 4.0X8 (Permanent Stent) ×1 IMPLANT
SYNERGY XD 4.50X16 (Permanent Stent) ×1 IMPLANT
WIRE RUNTHROUGH .014X180CM (WIRE) ×1

## 2023-09-09 NOTE — ED Provider Notes (Signed)
 [place] [facility] AT [facility] Provider Note  CSN: [id number] Arrival date & time: 09/09/23 0908  Chief Complaint(s) Chest Pain  HPI Mark Barrett is a 43 y.o. male with past medical history as below, significant for GERD, migraines, depression who presents to the ED with complaint of cp  Onset around 0730, crushing chest pain w/ pressure left sided to shoulder. Diaphoretic, dyspnea reported. No similar symptoms previously, denies personal cardiac history.  Does report family cardiac history with MI at early age per the patient.  Past Medical History Past Medical History:  Diagnosis Date   Back pain    Depression    Diarrhea    Erectile dysfunction    GERD (gastroesophageal reflux disease)    Joint pain    Migraines    Seasonal allergies    There are no active problems to display for this patient.  Home Medication(s) Prior to Admission medications   Medication Sig Start Date End Date Taking? Authorizing Provider  Ascorbic Acid (VITAMIN C) 100 MG tablet Take 250 mg by mouth daily.    [provider]  cetirizine (ZYRTEC) 10 MG tablet Take 10 mg by mouth daily.    [provider]  ibuprofen (ADVIL,MOTRIN) 400 MG tablet Take 400 mg by mouth every 6 (six) hours as needed.    [provider]  sildenafil (VIAGRA) 25 MG tablet Take 25 mg by mouth daily as needed for erectile dysfunction.    [provider]  SUMAtriptan (IMITREX) 50 MG tablet Take 50 mg by mouth every 2 (two) hours as needed for migraine. May repeat in 2 hours if headache persists or recurs.    [provider]  Topiramate (TOPAMAX PO) Take 100 mg by mouth 2 (two) times daily.     [provider]  Vitamin D, Ergocalciferol, (DRISDOL) 1.25 MG (50000 UT) CAPS capsule Take 1 capsule (50,000 Units total) by mouth every 7 (seven) days. 06/18/18   Langston Reusing, MD                                                                                                                                     Past Surgical History Past Surgical History:  Procedure Laterality Date   ANKLE SURGERY     WISDOM TOOTH EXTRACTION  2008   Family History Family History  Problem Relation Age of Onset   Hypertension Mother    Hyperlipidemia Mother    Obesity Mother    Heart disease Father    Cancer Father    Sleep apnea Father     Social History Social History   Tobacco Use   Smoking status: Never   Smokeless tobacco: Never  Vaping Use   Vaping status: Never Used  Substance Use Topics   Alcohol use: No   Drug use: Never   Allergies Patient has no known allergies.  Review of Systems A thorough review of systems was  obtained and all systems are negative except as noted in the HPI and PMH.   Physical Exam Vital Signs  I have reviewed the triage vital signs BP 124/68   Pulse 73   Temp 97.7 F (36.5 C) (Oral)   Resp 20   Ht 6\' 4"  (1.93 m)   Wt (!) 152 kg   SpO2 99%   BMI 40.78 kg/m  Physical Exam Vitals and nursing note reviewed.  Constitutional:      General: He is in acute distress.     Appearance: He is well-developed. He is ill-appearing and diaphoretic.  HENT:     Head: Normocephalic and atraumatic.     Right Ear: External ear normal.     Left Ear: External ear normal.     Mouth/Throat:     Mouth: Mucous membranes are moist.  Eyes:     General: No scleral icterus. Cardiovascular:     Rate and Rhythm: Normal rate and regular rhythm.     Pulses: Normal pulses.     Heart sounds: Normal heart sounds.  Pulmonary:     Effort: Pulmonary effort is normal. No respiratory distress.     Breath sounds: Normal breath sounds.  Abdominal:     General: Abdomen is flat.     Palpations: Abdomen is soft.     Tenderness: There is no abdominal tenderness.  Musculoskeletal:     Cervical back: No rigidity.     Right lower leg: No edema.     Left lower leg: No edema.  Skin:    General: Skin is warm.     Capillary Refill:  Capillary refill takes less than 2 seconds.  Neurological:     Mental Status: He is alert.  Psychiatric:        Mood and Affect: Mood normal.        Behavior: Behavior normal.     ED Results and Treatments Labs (all labs ordered are listed, but only abnormal results are displayed) Labs Reviewed  CBC - Abnormal; Notable for the following components:      Result Value   WBC 10.8 (*)    All other components within normal limits  BASIC METABOLIC PANEL  HEMOGLOBIN A1C  PROTIME-INR  APTT  LIPID PANEL  LACTIC ACID, PLASMA  TROPONIN I (HIGH SENSITIVITY)                                                                                                                          Radiology No results found.  Pertinent labs & imaging results that were available during my care of the patient were reviewed by me and considered in my medical decision making (see MDM for details).  Medications Ordered in ED Medications  0.9 %  sodium chloride infusion (has no administration in time range)  nitroGLYCERIN (NITROSTAT) SL tablet 0.4 mg (0.4 mg Sublingual Given 09/09/23 0935)  heparin ADULT infusion 100 units/mL (25000 units/251mL) (1,500 Units/hr Intravenous New Bag/Given 09/09/23 0934)  nitroGLYCERIN 50  mg in dextrose 5 % 250 mL (0.2 mg/mL) infusion (5 mcg/min Intravenous New Bag/Given 09/09/23 0946)  aspirin chewable tablet 324 mg (324 mg Oral Given 09/09/23 0922)  heparin injection 4,000 Units (4,000 Units Intravenous Given 09/09/23 0927)                                                                                                                                     Procedures .Critical Care  Performed by: Sloan Leiter, DO Authorized by: Sloan Leiter, DO   Critical care provider statement:    Critical care time (minutes):  30   Critical care was necessary to treat or prevent imminent or life-threatening deterioration of the following conditions:  Cardiac failure   Critical care was time  spent personally by me on the following activities:  Development of treatment plan with patient or surrogate, discussions with consultants, evaluation of patient's response to treatment, examination of patient, ordering and review of laboratory studies, ordering and review of radiographic studies, ordering and performing treatments and interventions, pulse oximetry, re-evaluation of patient's condition, review of old charts and obtaining history from patient or surrogate   Care discussed with: admitting provider     (including critical care time)  Medical Decision Making / ED Course    Medical Decision Making:    Mark Barrett is a 43 y.o. male with past medical history as below, significant for GERD, migraines, depression who presents to the ED with complaint of cp. The complaint involves an extensive differential diagnosis and also carries with it a high risk of complications and morbidity.  Serious etiology was considered. Ddx includes but is not limited to: Differential includes all life-threatening causes for chest pain. This includes but is not exclusive to acute coronary syndrome, aortic dissection, pulmonary embolism, cardiac tamponade, community-acquired pneumonia, pericarditis, musculoskeletal chest wall pain, etc.   Complete initial physical exam performed, notably the patient was in mild distress, diaphoretic.    Reviewed and confirmed nursing documentation for past medical history, family history, social history.  Vital signs reviewed.       Brief summary: 43 year old male with history as above here with chest pain.  Onset this morning around 7:30 AM.  Crushing chest pain radiation to his left shoulder.  Initial EKG reviewed by myself in triage concerning for anterolateral MI.  Reviewed EKG from 2019 and new changes today. Given EKG changes and severe pain will activate code stemi.   Received aspirin, heparin bolus, will start heparin drip given prolonged transport time.  Patient  had mild improvement to his pain following nitroglycerin, will start nitro gtt given ongoing pain.  Discussed with STEMI doctor, agrees with STEMI activation.  Will send urgently to [facility] for Cath Lab.  CXR on wet read w/ cardiomegaly not seen on prior xr, pending formal radiology read.  Admit cardiology   --Of note he was on sildenafil prevoisuly, has not had in 2-3 years  per pt          Additional history obtained: -Additional history obtained from family -External records from outside source obtained and reviewed including: Chart review including previous notes, labs, imaging, consultation notes including  Prior EKG 2019 Prior office visits Home meds    Lab Tests: -I ordered, reviewed, and interpreted labs.   The pertinent results include:   Labs Reviewed  CBC - Abnormal; Notable for the following components:      Result Value   WBC 10.8 (*)    All other components within normal limits  BASIC METABOLIC PANEL  HEMOGLOBIN A1C  PROTIME-INR  APTT  LIPID PANEL  LACTIC ACID, PLASMA  TROPONIN I (HIGH SENSITIVITY)    Notable for cbc stable, labs o/w pending  EKG   EKG Interpretation Date/Time:  Monday September 09 2023 09:17:25 EST Ventricular Rate:  67 PR Interval:  154 QRS Duration:  107 QT Interval:  390 QTC Calculation: 412 R Axis:   84  Text Interpretation: Sinus rhythm Anterolateral infarct, acute (LAD) >>> Acute MI <<< Confirmed by Tanda Rockers ([id number]) on 09/09/2023 9:25:58 AM         Imaging Studies ordered: I ordered imaging studies including CXR I independently visualized the following imaging with scope of interpretation limited to determining acute life threatening conditions related to emergency care; findings noted above I independently visualized and interpreted imaging. I agree with the radiologist interpretation   Medicines ordered and prescription drug management: Meds ordered this encounter  Medications   aspirin chewable tablet 324 mg    0.9 %  sodium chloride infusion   heparin injection 4,000 Units   nitroGLYCERIN (NITROSTAT) SL tablet 0.4 mg   heparin 78469 UT/250ML infusion    Simms, Marva S: cabinet override   heparin ADULT infusion 100 units/mL (25000 units/261mL)   nitroGLYCERIN 50 mg in dextrose 5 % 250 mL (0.2 mg/mL) infusion    -I have reviewed the patients home medicines and have made adjustments as needed   Consultations Obtained: I requested consultation with the cardiology,  and discussed lab and imaging findings as well as pertinent plan - they recommend: cath lab   Cardiac Monitoring: The patient was maintained on a cardiac monitor.  I personally viewed and interpreted the cardiac monitored which showed an underlying rhythm of: nsr Continuous pulse oximetry interpreted by myself, 100% on RA.    Social Determinants of Health:  Diagnosis or treatment significantly limited by social determinants of health: obesity   Reevaluation: After the interventions noted above, I reevaluated the patient and found that they have improved  Co morbidities that complicate the patient evaluation  Past Medical History:  Diagnosis Date   Back pain    Depression    Diarrhea    Erectile dysfunction    GERD (gastroesophageal reflux disease)    Joint pain    Migraines    Seasonal allergies       Dispostion: Disposition decision including need for hospitalization was considered, and patient admitted to the hospital.    Final Clinical Impression(s) / ED Diagnoses Final diagnoses:  ST elevation myocardial infarction (STEMI), unspecified artery (HCC)        Sloan Leiter, DO 09/09/23 1001

## 2023-09-09 NOTE — Telephone Encounter (Addendum)
 Patient Product/process development scientist completed.    The patient is insured through [facility]. Patient has [employer], may use a copay card, and/or apply for patient assistance if available.    Ran test claim for Brilinta 90 mg and the current 30 day co-pay is $35.00.  Ran test claim for prasugrel (Effient) 10 mg and the current 30 day co-pay is $10.00.  This test claim was processed through [facility]- copay amounts may vary at other pharmacies due to pharmacy/plan contracts, or as the patient moves through the different stages of their insurance plan.     Roland Earl, CPHT Pharmacy Technician III Certified Patient Advocate [facility] Patient Advocate Team Direct Number: [phone]  Fax: [phone]

## 2023-09-09 NOTE — Plan of Care (Signed)

## 2023-09-09 NOTE — TOC CM/SW Note (Signed)
 Transition of Care [facility]) - Inpatient Brief Assessment   Patient Details  Name: Mark Barrett MRN: [id number] Date of Birth: 1980/10/17  Transition of Care [facility]) CM/SW Contact:    Gala Lewandowsky, RN Phone Number: 09/09/2023, 4:49 PM   Clinical Narrative: Patient presented for chest pain-post LHC. PTA patient was from home with support of spouse. Benefits check completed for Effient and Brilinta. Case Manager will continue to follow for transition of care needs as the patient progresses.     Transition of Care Asessment: Insurance and Status: Insurance coverage has been reviewed Patient has primary care physician: Yes Home environment has been reviewed: Home with spouse. Prior/Current Home Services: No current home services Social Drivers of Health Review: SDOH reviewed no interventions necessary Readmission risk has been reviewed: Yes Transition of care needs: no transition of care needs at this time

## 2023-09-09 NOTE — ED Notes (Signed)
 [facility] called for transport.

## 2023-09-09 NOTE — Progress Notes (Signed)
 PHARMACY - ANTICOAGULATION CONSULT NOTE  Pharmacy Consult for heparin Indication: chest pain/ACS  No Known Allergies  Patient Measurements: Height: 6\' 4"  (193 cm) Weight: (!) 152 kg (335 lb) IBW/kg (Calculated) : 86.8 Heparin Dosing Weight: 121.5kg  Vital Signs: Temp: 97.7 F (36.5 C) (02/17 0920) Temp Source: Oral (02/17 0920) BP: 146/98 (02/17 0914) Pulse Rate: 63 (02/17 0914)  Labs: Recent Labs    09/09/23 0915  HGB 15.9  HCT 46.1  PLT 309    CrCl cannot be calculated (Patient's most recent lab result is older than the maximum 21 days allowed.).   Medical History: Past Medical History:  Diagnosis Date   Back pain    Depression    Diarrhea    Erectile dysfunction    GERD (gastroesophageal reflux disease)    Joint pain    Migraines    Seasonal allergies     Medications:  Infusions:   sodium chloride     heparin     heparin      Assessment: 42 yom presented to the ED with CP. Code STEMI was called and now initiating IV heparin. Baseline CBC is WNL and he not on anticoagulation PTA.   Goal of Therapy:  Heparin level 0.3-0.7 units/ml Monitor platelets by anticoagulation protocol: Yes   Plan:  Heparin bolus 4000 units IV x 1 Heparin gtt 1500 units/hr Check a 6 hr heparin level vs f/u cath  Vishal Sandlin, Drake Leach 09/09/2023,9:33 AM

## 2023-09-09 NOTE — H&P (Signed)
 CARDIOLOGY ADMIT NOTE     Patient ID: Mark Barrett MRN: [id number] DOB/AGE: [birth year] 42 y.o.  Admit date: 09/09/2023.   Primary Physician:  Marianne Sofia, MD  Patient ID: Mark Barrett, male    DOB: May 01, 1981, 44 y.o.   MRN: [id number]  Chief Complaint  Patient presents with   Chest Pain   HPI:    Mark Barrett  is a 43 y.o. Caucasian male patient with severe OSA not on therapy, history of migraine headaches, well-controlled on NSAID and no history of illicit drug use, does not smoke or drink alcohol, strong family history of premature coronary disease with her father having had coronary artery disease in his mid 42 years of age, presented with crushing chest discomfort that started earlier this morning around 830 AM, presented [facility] emergency room at [facility] where EKG revealed ST elevation in V1-V6 suggestive of acute anterolateral STEMI.  He is emergently rushed to the cardiac catheter lab for further evaluation and management.  Patient still having chest discomfort on the cardiac catheterization table.  Past Medical History:  Diagnosis Date   Back pain    Depression    Diarrhea    Erectile dysfunction    GERD (gastroesophageal reflux disease)    Joint pain    Migraines    Seasonal allergies    Past Surgical History:  Procedure Laterality Date   ANKLE SURGERY     WISDOM TOOTH EXTRACTION  2008   Social History   Tobacco Use   Smoking status: Never   Smokeless tobacco: Never  Substance Use Topics   Alcohol use: No   Marital Status: Married   Family History  Problem Relation Age of Onset   Hypertension Mother    Hyperlipidemia Mother    Obesity Mother    Heart disease Father    Cancer Father    Sleep apnea Father     ROS  Review of Systems  Cardiovascular:  Positive for chest pain. Negative for dyspnea on exertion and leg swelling.   Objective      09/09/2023    9:50 AM 09/09/2023    9:45 AM 09/09/2023    9:40 AM  Vitals with BMI   Systolic  124 119  Diastolic  68 76  Pulse 73 76 72    Physical Exam Constitutional:      Appearance: He is morbidly obese.  Neck:     Vascular: No carotid bruit or JVD.  Cardiovascular:     Rate and Rhythm: Normal rate and regular rhythm.     Pulses: Intact distal pulses.     Heart sounds: Normal heart sounds. No murmur heard.    No gallop.  Pulmonary:     Effort: Pulmonary effort is normal.     Breath sounds: Normal breath sounds.  Abdominal:     General: Bowel sounds are normal.     Palpations: Abdomen is soft.  Musculoskeletal:     Right lower leg: No edema.     Left lower leg: No edema.    Laboratory examination:   Recent Labs    09/09/23 0915  NA 138  K 3.4*  CL 103  CO2 21*  GLUCOSE 122*  BUN 20  CREATININE 0.86  CALCIUM 9.4  GFRNONAA >60   estimated creatinine clearance is 180.5 mL/min (by C-G formula based on SCr of 0.86 mg/dL).     Latest Ref Rng & Units 09/09/2023    9:15 AM 05/19/2018   12:23 PM  CMP  Glucose 70 - 99 mg/dL 161  94   BUN 6 - 20 mg/dL 20  23   Creatinine 0.96 - 1.24 mg/dL 0.45  4.09   Sodium 811 - 145 mmol/L 138  141   Potassium 3.5 - 5.1 mmol/L 3.4  4.4   Chloride 98 - 111 mmol/L 103  104   CO2 22 - 32 mmol/L 21  20   Calcium 8.9 - 10.3 mg/dL 9.4  9.5   Total Protein 6.0 - 8.5 g/dL  7.4   Total Bilirubin 0.0 - 1.2 mg/dL  0.3   Alkaline Phos 39 - 117 IU/L  92   AST 0 - 40 IU/L  32   ALT 0 - 44 IU/L  71       Latest Ref Rng & Units 09/09/2023    9:15 AM 05/19/2018   12:23 PM  CBC  WBC 4.0 - 10.5 K/uL 10.8  6.8   Hemoglobin 13.0 - 17.0 g/dL 91.4  78.2   Hematocrit 39.0 - 52.0 % 46.1  44.2   Platelets 150 - 400 K/uL 309     Lipid Panel     Component Value Date/Time   CHOL 245 (H) 05/19/2018 1223   TRIG 162 (H) 05/19/2018 1223   HDL 40 05/19/2018 1223   LDLCALC 173 (H) 05/19/2018 1223   HEMOGLOBIN A1C Lab Results  Component Value Date   HGBA1C 5.8 (H) 05/19/2018   TSH No results for input(s): "TSH" in the last  8760 hours. BNP (last 3 results) No results for input(s): "BNP" in the last 8760 hours. Cardiac Panel (last 3 results) Recent Labs    09/09/23 0915  TROPONINIHS 4     Medications and allergies  No Known Allergies    sodium chloride     heparin 1,500 Units/hr (09/09/23 0934)   nitroGLYCERIN 5 mcg/min (09/09/23 0946)   Current Outpatient Medications  Medication Instructions   cetirizine (ZYRTEC) 10 mg, Oral, Daily   ibuprofen (ADVIL) 400 mg, Every 6 hours PRN   SUMAtriptan (IMITREX) 50 mg, Oral, Every 2 hours PRN, May repeat in 2 hours if headache persists or recurs.    Topiramate (TOPAMAX PO) 100 mg, Oral, 2 times daily   vitamin C 250 mg, Oral, Daily   Vitamin D (Ergocalciferol) (DRISDOL) 50,000 Units, Oral, Every 7 Barrett    No intake/output data recorded. No intake/output data recorded.    Radiology:  [facility] Chest Port 1 View Result Date: 09/09/2023 CLINICAL DATA:  Chest pain. EXAM: PORTABLE CHEST 1 VIEW COMPARISON:  08/25/2016. FINDINGS: Low lung volume. Bilateral lung fields are clear. Bilateral costophrenic angles are clear. Normal cardio-mediastinal silhouette. No acute osseous abnormalities. The soft tissues are within normal limits. IMPRESSION: No active disease. Electronically Signed   By: Jules Schick M.D.   On: 09/09/2023 10:32    Cardiac Studies:   EKG 09/09/2023: Normal sinus rhythm at rate of 68 bpm, normal axis, ST elevation in V1-V5 suggestive of acute anterolateral STEMI.  Assessment   1.  Acute anterolateral STEMI 2.  Hypercholesterolemia 3.  Strong family still premature coronary disease in his father in his 81s who had coronary artery disease. 4.  Morbid obesity 5.  Severe obstructive sleep apnea, not on therapy  Recommendations:   Patient taken directly to the cardiac catheterization lab on a emergent basis.  Please see cardiac catheterization report.  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  Further recommendations will  follow.  Will manage his risk factors appropriately including his lipid management, obesity and address his sleep apnea at a later date upon discharge.   Yates Decamp, MD, [facility] 09/09/2023, 10:22 AM Office: [phone] Fax: [phone] Pager: [phone]

## 2023-09-09 NOTE — ED Notes (Signed)
 2nd nitroglycerin did not dissolve. Pt started on nitroglycerin drip

## 2023-09-09 NOTE — ED Notes (Signed)
 Code Stemi called to [facility]@9 :24 per ED Physician request.  Spoke with Mora Bellman

## 2023-09-09 NOTE — ED Triage Notes (Addendum)
 C/o chest tightness/pain worsening since 0730 this morning. Pain radiating into back and left arm. Also c/o shortness of breath. Pale & diaphoretic in triage. States felt the pain started like indigestion, a lot of belching. Pressure worsening while in triage

## 2023-09-09 NOTE — ED Notes (Signed)
Report given to cathlab and [facility].  ?

## 2023-09-10 ENCOUNTER — Other Ambulatory Visit (HOSPITAL_COMMUNITY): Payer: Self-pay

## 2023-09-10 ENCOUNTER — Inpatient Hospital Stay (HOSPITAL_COMMUNITY): Payer: 59

## 2023-09-10 DIAGNOSIS — I10 Essential (primary) hypertension: Secondary | ICD-10-CM | POA: Insufficient documentation

## 2023-09-10 DIAGNOSIS — I2109 ST elevation (STEMI) myocardial infarction involving other coronary artery of anterior wall: Secondary | ICD-10-CM | POA: Diagnosis not present

## 2023-09-10 DIAGNOSIS — I5021 Acute systolic (congestive) heart failure: Secondary | ICD-10-CM | POA: Insufficient documentation

## 2023-09-10 DIAGNOSIS — E785 Hyperlipidemia, unspecified: Secondary | ICD-10-CM | POA: Insufficient documentation

## 2023-09-10 LAB — ECHOCARDIOGRAM COMPLETE
AR max vel: 3.1 cm2
AV Area VTI: 3.38 cm2
AV Area mean vel: 3 cm2
AV Mean grad: 3 mm[Hg]
AV Peak grad: 4.6 mm[Hg]
Ao pk vel: 1.07 m/s
Area-P 1/2: 4.49 cm2
Height: 76 in
S' Lateral: 4.9 cm
Weight: 5360 [oz_av]

## 2023-09-10 LAB — BASIC METABOLIC PANEL
Anion gap: 12 (ref 5–15)
BUN: 12 mg/dL (ref 6–20)
CO2: 23 mmol/L (ref 22–32)
Calcium: 9.3 mg/dL (ref 8.9–10.3)
Chloride: 103 mmol/L (ref 98–111)
Creatinine, Ser: 0.77 mg/dL (ref 0.61–1.24)
GFR, Estimated: >60 mL/min (ref >60)
Glucose, Bld: 120 mg/dL — ABNORMAL HIGH (ref 70–99)
Potassium: 3.9 mmol/L (ref 3.5–5.1)
Sodium: 138 mmol/L (ref 135–145)

## 2023-09-10 LAB — CBC
HCT: 43.8 % (ref 39.0–52.0)
Hemoglobin: 15.1 g/dL (ref 13.0–17.0)
MCH: 30.1 pg (ref 26.0–34.0)
MCHC: 34.5 g/dL (ref 30.0–36.0)
MCV: 87.4 fL (ref 80.0–100.0)
Platelets: 247 10*3/uL (ref 150–400)
RBC: 5.01 MIL/uL (ref 4.22–5.81)
RDW: 12.8 % (ref 11.5–15.5)
WBC: 12.3 10*3/uL — ABNORMAL HIGH (ref 4.0–10.5)
nRBC: 0 % (ref 0.0–0.2)

## 2023-09-10 LAB — LIPID PANEL
Cholesterol: 208 mg/dL — ABNORMAL HIGH (ref 0–200)
HDL: 45 mg/dL (ref >40)
LDL Cholesterol: 137 mg/dL — ABNORMAL HIGH (ref 0–99)
Total CHOL/HDL Ratio: 4.6 {ratio}
Triglycerides: 128 mg/dL (ref <150)
VLDL: 26 mg/dL (ref 0–40)

## 2023-09-10 LAB — LACTIC ACID, PLASMA: Lactic Acid, Venous: 2.4 mmol/L (ref 0.5–1.9)

## 2023-09-10 MED ORDER — SPIRONOLACTONE 25 MG PO TABS
12.5000 mg | ORAL_TABLET | Freq: Every day | ORAL | 1 refills | Status: DC
  Filled 2023-09-10: qty 15, 30d supply, fill #0

## 2023-09-10 MED ORDER — NITROGLYCERIN 0.4 MG SL SUBL
0.4000 mg | SUBLINGUAL_TABLET | SUBLINGUAL | 2 refills | Status: AC | PRN
  Filled 2023-09-10: qty 25, 5d supply, fill #0

## 2023-09-10 MED ORDER — PERFLUTREN LIPID MICROSPHERE
1.0000 mL | INTRAVENOUS | Status: AC | PRN
  Administered 2023-09-10: 2 mL via INTRAVENOUS

## 2023-09-10 MED ORDER — ATORVASTATIN CALCIUM 80 MG PO TABS
80.0000 mg | ORAL_TABLET | Freq: Every day | ORAL | 1 refills | Status: DC
  Filled 2023-09-10: qty 30, 30d supply, fill #0

## 2023-09-10 MED ORDER — PRASUGREL HCL 10 MG PO TABS
10.0000 mg | ORAL_TABLET | Freq: Every day | ORAL | 2 refills | Status: DC
  Filled 2023-09-10: qty 30, 30d supply, fill #0

## 2023-09-10 MED ORDER — LOSARTAN POTASSIUM 50 MG PO TABS
50.0000 mg | ORAL_TABLET | Freq: Every evening | ORAL | 1 refills | Status: DC
  Filled 2023-09-10: qty 30, 30d supply, fill #0

## 2023-09-10 MED ORDER — ASPIRIN 81 MG PO CHEW
81.0000 mg | CHEWABLE_TABLET | Freq: Every day | ORAL | 2 refills | Status: AC
  Filled 2023-09-10: qty 90, 90d supply, fill #0

## 2023-09-10 NOTE — Progress Notes (Signed)
 CARDIAC REHAB PHASE I   PRE:  Rate/Rhythm: 73 NS  BP:  Sitting: 142/100      SpO2: 96 RA  MODE:  Ambulation: 470 ft    POST:  Rate/Rhythm: 90 NSR  BP:  Sitting: 136/100      SpO2: 96 RA  Pt ambulated with supervision assistance in the hallway. Pt walked well w/o abnormal s/s and was educated with family present.   Pt was educated on STEMI, stent card, stent location, Antiplatelet and ASA use, wt restrictions, no baths/daily wash-ups, s/s of infection, ex guidelines, s/s to stop exercising, NTG use and calling 911, heart healthy diet, risk factors and CRPII. Pt received MI book and materials on exercise, diet, and CRPII. Will refer to [facility].    Faustino Congress  MS, ACSM-CEP 10:04 AM 09/10/2023    Service time is from 0928 to 1000.

## 2023-09-10 NOTE — Discharge Summary (Signed)
 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  No pressure gradient across the aortic valve. Normal cardiac output and cardiac index, no evidence of pulmonary hypertension.   Angiographic data: LM: Very large caliber vessel.  Smooth and normal. LAD: Large-caliber vessel, has a ulcerated 99% stenosis at the D1.  Otherwise LAD has mild disease. RI: Small to moderate caliber vessel which is smooth and normal. Cx: Small OM1 moderate-sized OM 2 and a smaller OM 3.  OM 2 has a 20 to 30% ostial stenosis. RCA: Very large caliber vessel and dominant vessel.  There is a 10 to 20% luminal irregularity in the proximal and mid segment.   Intervention data: Successful PTCA and stenting of the proximal LAD ulcerated 99% stenosis, a 4.5 x 16 mm stent was deployed at 16 atmospheric pressure and due to edge dissection a 4.0 x 8 mm Synergy XD was deployed in the distal and, stenosis reduced overall from 99% to 0% with TIMI-3 to TIMI-3 flow.  No immediate complications.       Impression and recommendations: Patient will be discharged home hopefully tomorrow if he remains stable, he will need aggressive risk factor modification.  DAPT for 1 year.  Echo: 09/10/2023  IMPRESSIONS     1. Left ventricular ejection fraction, by estimation, is 40 to 45%. The  left ventricle has mildly decreased function. The left ventricle  demonstrates regional wall motion  abnormalities (see scoring  diagram/findings for description). The left ventricular   internal cavity size was mildly dilated. Left ventricular diastolic  parameters are consistent with Grade I diastolic dysfunction (impaired  relaxation). Apical wall motion abnormalities consistent with LAD disease.  No LV thrombus noted.   2. Right ventricular systolic function is normal. The right ventricular  size is normal.   3. The mitral valve is normal in structure. No evidence of mitral valve  regurgitation. No evidence of mitral stenosis.   4. The aortic valve is tricuspid. Aortic valve regurgitation is not  visualized. No aortic stenosis is present.   5. Aortic dilatation noted. There is mild dilatation of the aortic root,  measuring 40 mm.   6. Evidence of atrial level shunting detected by color flow Doppler.   Comparison(s): No prior Echocardiogram.   FINDINGS   Left Ventricle: Left ventricular ejection fraction, by estimation, is 40  to 45%. The left ventricle has mildly decreased function. The left  ventricle demonstrates regional wall motion abnormalities. Definity  contrast agent was given IV to delineate the  left ventricular endocardial borders. Strain imaging was not performed.  The left ventricular internal cavity size was mildly dilated. There is no  left ventricular hypertrophy. Left ventricular diastolic parameters are  consistent with Grade I diastolic  dysfunction (impaired relaxation).     LV Wall Scoring:  The apical lateral segment, apical septal segment, apical anterior  segment,  and apical inferior segment are hypokinetic. Apical wall motion  abnormalities  consistent with LAD disease. No LV thrombus noted.  Right Ventricle: The right ventricular size is normal. No increase in  right ventricular wall thickness. Right ventricular systolic function is  normal.   Left Atrium: Left atrial size was normal in size.   Right Atrium: Right atrial size was normal in  size.   Pericardium: Trivial pericardial effusion is present. The pericardial  effusion is circumferential.   Mitral Valve: The mitral valve is normal in structure. No evidence of  mitral valve regurgitation. No evidence of mitral valve stenosis.   Tricuspid Valve: The tricuspid valve is normal in structure. Tricuspid  valve regurgitation is not demonstrated. No evidence of tricuspid  stenosis.   Aortic Valve: The aortic valve is tricuspid. Aortic valve regurgitation is  not visualized. No aortic stenosis is present. Aortic valve mean gradient  measures 3.0 mmHg. Aortic valve peak gradient measures 4.6 mmHg. Aortic  valve area, by VTI measures 3.38  cm.   Pulmonic Valve: The pulmonic valve was normal in structure. Pulmonic valve  regurgitation is not visualized. No evidence of pulmonic stenosis.   Aorta: Aortic dilatation noted. There is mild dilatation of the aortic  root, measuring 40 mm.   IAS/Shunts: Evidence of atrial level shunting detected by color flow  Doppler.   Additional Comments: 3D imaging was not performed.   _____________   History of Present Illness     Mark Barrett is a 43 y.o. male with severe OSA not on therapy, history of migraine headaches, well-controlled on NSAID and no history of illicit drug use, does not smoke or drink alcohol, strong family history of premature coronary disease with her father having had coronary artery disease in his mid 42 years of age, presented with crushing chest discomfort that started earlier the morning of admission around 830 AM, presented to the emergency room at [facility] where EKG revealed ST elevation in V1-V6 suggestive of acute anterolateral STEMI. He was taken emergently to the cath lab for cardiac catheterization.   Hospital Course     STEMI -- Underwent cardiac catheterization noted above with successful PCI to proximal LAD with ulcerated 99% stenosis with DES x 1 with edge dissection requiring  additional DES.  No immediate postprocedural complications.  Recommendations for DAPT with aspirin/Effient for 1 year.  High-sensitivity troponin peaked at greater than 24,000 -- RHC LVEDP 11, wedge pressure 9, cardiac index 1.8 -- Lactic acid 3.7>> 2.1. Repeat lactic acid the following day 2.4 (but suspect lab error as it may not have been placed on ice after draw) -- Continue aspirin, Effient, atorvastatin 80 mg daily, losartan 50 mg daily  Hyperlipidemia -- Previous LDL 173 (2019) -- LDL 137, HDL 45, LP(a) pending -- Continue atorvastatin 80 mg daily, lipid clinic referral at [place]  Hypertension -- Continue losartan 50 mg daily -- Initially started on metoprolol XL 50 mg daily, stopped in the setting of cardiac index of 1.8  HFmrEF -- echo showed LVEF of 40-45%, g1DD, apical lateral/septal/anterior segments hypokinetic -- GDMT: initially placed on Metoprolol (stopped in the setting of CI 1.8) continue losartan 25mg  daily, add spiro 12.5mg  daily at discharge   General: Well developed, well nourished, male appearing in no acute distress. Head: Normocephalic, atraumatic.  Neck: Supple without bruits, JVD. Lungs:  Resp regular and unlabored, CTA. Heart: RRR, S1, S2, no S3, S4, or murmur; no rub. Abdomen: Soft, non-tender, non-distended with normoactive bowel sounds. No hepatomegaly. No rebound/guarding. No obvious abdominal masses. Extremities: No clubbing, cyanosis, edema. Distal pedal pulses are 2+ bilaterally. Right radial cath site stable  without bruising or hematoma Neuro: Alert and oriented X 3. Moves all extremities spontaneously. Psych: Normal affect.   Did the patient have an acute coronary syndrome (MI, NSTEMI, STEMI, etc) this admission?:  Yes                               AHA/ACC ACS Clinical Performance & Quality Measures: Aspirin prescribed? - Yes ADP Receptor Inhibitor (Plavix/Clopidogrel, Brilinta/Ticagrelor or Effient/Prasugrel) prescribed (includes medically managed  patients)? - Yes Beta Blocker prescribed? - No - low CI High Intensity Statin (Lipitor 40-80mg  or Crestor 20-40mg ) prescribed? - Yes EF assessed during THIS hospitalization? - Yes For EF <40%, was ACEI/ARB prescribed? - Yes For EF <40%, Aldosterone Antagonist (Spironolactone or Eplerenone) prescribed? - Yes Cardiac Rehab Phase II ordered (including medically managed patients)? - Yes       The patient will be scheduled for a TOC follow up appointment in 10-14 days.  A message has been sent to the [facility] and Scheduling Pool at the office where the patient should be seen for follow up.  _____________  Discharge Vitals Blood pressure 135/81, pulse 66, temperature 97.9 F (36.6 C), temperature source Oral, resp. rate 16, height 6\' 4"  (1.93 m), weight (!) 152 kg, SpO2 98%.  Filed Weights   09/09/23 0912  Weight: (!) 152 kg    Labs & Radiologic Studies    CBC Recent Labs    09/09/23 0915 09/09/23 1113 09/09/23 1119 09/10/23 0530  WBC 10.8*  --   --  12.3*  HGB 15.9   < > 13.9 15.1  HCT 46.1   < > 41.0 43.8  MCV 87.1  --   --  87.4  PLT 309  --   --  247   < > = values in this interval not displayed.   Basic Metabolic Panel Recent Labs    16/10/96 0915 09/09/23 1113 09/09/23 1119 09/10/23 0530  NA 138   < > 140 138  K 3.4*   < > 3.7 3.9  CL 103  --   --  103  CO2 21*  --   --  23  GLUCOSE 122*  --   --  120*  BUN 20  --   --  12  CREATININE 0.86  --   --  0.77  CALCIUM 9.4  --   --  9.3   < > = values in this interval not displayed.   Liver Function Tests No results for input(s): "AST", "ALT", "ALKPHOS", "BILITOT", "PROT", "ALBUMIN" in the last 72 hours. No results for input(s): "LIPASE", "AMYLASE" in the last 72 hours. High Sensitivity Troponin:   Recent Labs  Lab 09/09/23 0915 09/09/23 1441  TROPONINIHS 4 >24,000*    BNP Invalid input(s): "POCBNP" D-Dimer No results for input(s): "DDIMER" in the last 72 hours. Hemoglobin A1C No results for input(s):  "HGBA1C" in the last 72 hours. Fasting Lipid Panel Recent Labs    09/10/23 0530  CHOL 208*  HDL 45  LDLCALC 137*  TRIG 128  CHOLHDL 4.6   Thyroid Function Tests No results for input(s): "TSH", "T4TOTAL", "T3FREE", "THYROIDAB" in the last 72 hours.  Invalid input(s): "FREET3" _____________  ECHOCARDIOGRAM COMPLETE Result Date: 09/10/2023    ECHOCARDIOGRAM REPORT   Patient Name:   Mark Barrett Date of Exam: 09/10/2023 Medical Rec #:  [id number]    Height:       76.0 in Accession #:    [id number]  Weight:       335.0 lb Date of Birth:  05/15/1981     BSA:          2.758 m Patient Age:    42 years     BP:           146/92 mmHg Patient Gender: M            HR:           65 bpm. Exam Location:  Inpatient Procedure: 2D Echo, Color Doppler, Cardiac Doppler and Intracardiac            Opacification Agent (Both Spectral and Color Flow Doppler were            utilized during procedure). Indications:    Acute ST elevation myocardial infarction (STEMI) of                 anterolateral wall (HCC)  History:        Patient has no prior history of Echocardiogram examinations.  Sonographer:    Darlys Gales Referring Phys: [id number] Yates Decamp IMPRESSIONS  1. Left ventricular ejection fraction, by estimation, is 40 to 45%. The left ventricle has mildly decreased function. The left ventricle demonstrates regional wall motion abnormalities (see scoring diagram/findings for description). The left ventricular  internal cavity size was mildly dilated. Left ventricular diastolic parameters are consistent with Grade I diastolic dysfunction (impaired relaxation). Apical wall motion abnormalities consistent with LAD disease. No LV thrombus noted.  2. Right ventricular systolic function is normal. The right ventricular size is normal.  3. The mitral valve is normal in structure. No evidence of mitral valve regurgitation. No evidence of mitral stenosis.  4. The aortic valve is tricuspid. Aortic valve regurgitation is not visualized. No  aortic stenosis is present.  5. Aortic dilatation noted. There is mild dilatation of the aortic root, measuring 40 mm.  6. Evidence of atrial level shunting detected by color flow Doppler. Comparison(s): No prior Echocardiogram. FINDINGS  Left Ventricle: Left ventricular ejection fraction, by estimation, is 40 to 45%. The left ventricle has mildly decreased function. The left ventricle demonstrates regional wall motion abnormalities. Definity contrast agent was given IV to delineate the left ventricular endocardial borders. Strain imaging was not performed. The left ventricular internal cavity size was mildly dilated. There is no left ventricular hypertrophy. Left ventricular diastolic parameters are consistent with Grade I diastolic dysfunction (impaired relaxation).  LV Wall Scoring: The apical lateral segment, apical septal segment, apical anterior segment, and apical inferior segment are hypokinetic. Apical wall motion abnormalities consistent with LAD disease. No LV thrombus noted. Right Ventricle: The right ventricular size is normal. No increase in right ventricular wall thickness. Right ventricular systolic function is normal. Left Atrium: Left atrial size was normal in size. Right Atrium: Right atrial size was normal in size. Pericardium: Trivial pericardial effusion is present. The pericardial effusion is circumferential. Mitral Valve: The mitral valve is normal in structure. No evidence of mitral valve regurgitation. No evidence of mitral valve stenosis. Tricuspid Valve: The tricuspid valve is normal in structure. Tricuspid valve regurgitation is not demonstrated. No evidence of tricuspid stenosis. Aortic Valve: The aortic valve is tricuspid. Aortic valve regurgitation is not visualized. No aortic stenosis is present. Aortic valve mean gradient measures 3.0 mmHg. Aortic valve peak gradient measures 4.6 mmHg. Aortic valve area, by VTI measures 3.38 cm. Pulmonic Valve: The pulmonic valve was normal in  structure. Pulmonic valve regurgitation is not visualized. No evidence of pulmonic stenosis. Aorta:  Aortic dilatation noted. There is mild dilatation of the aortic root, measuring 40 mm. IAS/Shunts: Evidence of atrial level shunting detected by color flow Doppler. Additional Comments: 3D imaging was not performed.  LEFT VENTRICLE PLAX 2D LVIDd:         6.30 cm   Diastology LVIDs:         4.90 cm   LV e' medial:    5.98 cm/s LV PW:         0.90 cm   LV E/e' medial:  8.6 LV IVS:        1.00 cm   LV e' lateral:   7.18 cm/s LVOT diam:     2.10 cm   LV E/e' lateral: 7.2 LV SV:         67 LV SV Index:   24 LVOT Area:     3.46 cm  RIGHT VENTRICLE RV S prime:     15.70 cm/s TAPSE (M-mode): 2.5 cm LEFT ATRIUM             Index        RIGHT ATRIUM           Index LA Vol (A2C):   62.5 ml 22.66 ml/m  RA Area:     19.00 cm LA Vol (A4C):   45.3 ml 16.42 ml/m  RA Volume:   57.60 ml  20.88 ml/m LA Biplane Vol: 55.5 ml 20.12 ml/m  AORTIC VALVE AV Area (Vmax):    3.10 cm AV Area (Vmean):   3.00 cm AV Area (VTI):     3.38 cm AV Vmax:           107.00 cm/s AV Vmean:          85.200 cm/s AV VTI:            0.199 m AV Peak Grad:      4.6 mmHg AV Mean Grad:      3.0 mmHg LVOT Vmax:         95.80 cm/s LVOT Vmean:        73.800 cm/s LVOT VTI:          0.194 m LVOT/AV VTI ratio: 0.97  AORTA Ao Root diam: 4.00 cm MITRAL VALVE MV Area (PHT): 4.49 cm    SHUNTS MV Decel Time: 169 msec    Systemic VTI:  0.19 m MV E velocity: 51.40 cm/s  Systemic Diam: 2.10 cm MV A velocity: 73.40 cm/s MV E/A ratio:  0.70 Riley Lam MD Electronically signed by Riley Lam MD Signature Date/Time: 09/10/2023/1:27:28 PM    Final    CARDIAC CATHETERIZATION Result Date: 09/09/2023 Images from the original result were not included. Left Heart Catheterization 09/09/23: Hemodynamic data: LVEDP 22 mmHg.  No pressure gradient across the aortic valve. Normal cardiac output and cardiac index, no evidence of pulmonary hypertension. Angiographic  data: LM: Very large caliber vessel.  Smooth and normal. LAD: Large-caliber vessel, has a ulcerated 99% stenosis at the D1.  Otherwise LAD has mild disease. RI: Small to moderate caliber vessel which is smooth and normal. Cx: Small OM1 moderate-sized OM 2 and a smaller OM 3.  OM 2 has a 20 to 30% ostial stenosis. RCA: Very large caliber vessel and dominant vessel.  There is a 10 to 20% luminal irregularity in the proximal and mid segment. Intervention data: Successful PTCA and stenting of the proximal LAD ulcerated 99% stenosis, a 4.5 x 16 mm stent was deployed at 16 atmospheric pressure and due to edge dissection  a 4.0 x 8 mm Synergy XD was deployed in the distal and, stenosis reduced overall from 99% to 0% with TIMI-3 to TIMI-3 flow.  No immediate complications. Impression and recommendations: Patient will be discharged home hopefully tomorrow if he remains stable, he will need aggressive risk factor modification.  DAPT for 1 year.   DG Chest Port 1 View Result Date: 09/09/2023 CLINICAL DATA:  Chest pain. EXAM: PORTABLE CHEST 1 VIEW COMPARISON:  08/25/2016. FINDINGS: Low lung volume. Bilateral lung fields are clear. Bilateral costophrenic angles are clear. Normal cardio-mediastinal silhouette. No acute osseous abnormalities. The soft tissues are within normal limits. IMPRESSION: No active disease. Electronically Signed   By: Jules Schick M.D.   On: 09/09/2023 10:32   Disposition   Pt is being discharged home today in good condition.  Follow-up Plans & Appointments     Discharge Instructions     AMB Referral to [facility]   Complete by: As directed    Malena Peer   Reason For Referral: Lipids   Amb Referral to Cardiac Rehabilitation   Complete by: As directed    Diagnosis:  Coronary Stents STEMI     After initial evaluation and assessments completed: Virtual Based Care may be provided alone or in conjunction with Phase 2 Cardiac Rehab based on patient barriers.: Yes   Intensive  Cardiac Rehabilitation (ICR) [facility] location only OR Traditional Cardiac Rehabilitation (TCR) *If criteria for ICR are not met will enroll in TCR [facility] only): Yes   Call MD for:  difficulty breathing, headache or visual disturbances   Complete by: As directed    Call MD for:  persistant dizziness or light-headedness   Complete by: As directed    Call MD for:  redness, tenderness, or signs of infection (pain, swelling, redness, odor or green/yellow discharge around incision site)   Complete by: As directed    Diet - low sodium heart healthy   Complete by: As directed    Discharge instructions   Complete by: As directed    Radial Site Care Refer to this sheet in the next few weeks. These instructions provide you with information on caring for yourself after your procedure. Your caregiver may also give you more specific instructions. Your treatment has been planned according to current medical practices, but problems sometimes occur. Call your caregiver if you have any problems or questions after your procedure. HOME CARE INSTRUCTIONS You may shower the day after the procedure. Remove the bandage (dressing) and gently wash the site with plain soap and water. Gently pat the site dry.  Do not apply powder or lotion to the site.  Do not submerge the affected site in water for 3 to 5 days.  Inspect the site at least twice daily.  Do not flex or bend the affected arm for 24 hours.  No lifting over 5 pounds (2.3 kg) for 5 days after your procedure.  Do not drive home if you are discharged the same day of the procedure. Have someone else drive you.  You may drive 24 hours after the procedure unless otherwise instructed by your caregiver.  What to expect: Any bruising will usually fade within 1 to 2 weeks.  Blood that collects in the tissue (hematoma) may be painful to the touch. It should usually decrease in size and tenderness within 1 to 2 weeks.  SEEK IMMEDIATE MEDICAL CARE IF: You have unusual pain  at the radial site.  You have redness, warmth, swelling, or pain at the radial site.  You have drainage (other than a small amount of blood on the dressing).  You have chills.  You have a fever or persistent symptoms for more than 72 hours.  You have a fever and your symptoms suddenly get worse.  Your arm becomes pale, cool, tingly, or numb.  You have heavy bleeding from the site. Hold pressure on the site.   PLEASE DO NOT MISS ANY DOSES OF YOUR EFFIENT!!!!! Also keep a log of you blood pressures and bring back to your follow up appt. Please call the office with any questions.   Patients taking blood thinners should generally stay away from medicines like ibuprofen, Advil, Motrin, naproxen, and Aleve due to risk of stomach bleeding. You may take Tylenol as directed or talk to your primary doctor about alternatives.   PLEASE ENSURE THAT YOU DO NOT RUN OUT OF YOUR EFFIENT. This medication is very important to remain on for at least one year. IF you have issues obtaining this medication due to cost please CALL the office 3-5 business days prior to running out in order to prevent missing doses of this medication.   Increase activity slowly   Complete by: As directed         Discharge Medications   Allergies as of 09/10/2023   No Known Allergies      Medication List     STOP taking these medications    BERBERINE HCI PO   ibuprofen 400 MG tablet Commonly known as: ADVIL   UNABLE TO FIND       TAKE these medications    aspirin 81 MG chewable tablet Chew 1 tablet (81 mg total) by mouth daily.   atorvastatin 80 MG tablet Commonly known as: LIPITOR Take 1 tablet (80 mg total) by mouth daily.   calcium carbonate 500 MG chewable tablet Commonly known as: TUMS - dosed in mg elemental calcium Chew 1-2 tablets by mouth daily as needed for indigestion or heartburn.   cetirizine 10 MG tablet Commonly known as: ZYRTEC Take 10 mg by mouth daily.   ELDERBERRY PO Take 2 each  by mouth daily after breakfast. May take an extra 2 gummies when feeling sick   Emergen-C Immune Plus/Vit D Chew Chew 2 each by mouth daily after breakfast. Gummies   losartan 50 MG tablet Commonly known as: COZAAR Take 1 tablet (50 mg total) by mouth every evening.   nitroGLYCERIN 0.4 MG SL tablet Commonly known as: Nitrostat Place 1 tablet (0.4 mg total) under the tongue every 5 (five) minutes as needed.   prasugrel 10 MG Tabs tablet Commonly known as: EFFIENT Take 1 tablet (10 mg total) by mouth daily.   spironolactone 25 MG tablet Commonly known as: Aldactone Take 0.5 tablets (12.5 mg total) by mouth daily.   TOPAMAX PO Take 100 mg by mouth 2 (two) times daily as needed.   UNABLE TO FIND Take 1 tablet by mouth daily after breakfast. Vitamin D3 + K2         Outstanding Labs/Studies   FLP/LFTs in 8 weeks BMET at follow up appt  Duration of Discharge Encounter: APP Time: 25 minutes   Signed, Laverda Page, NP 09/10/2023, 2:11 PM

## 2023-09-10 NOTE — Discharge Instructions (Signed)
 Information about your medication: Effient (anti-platelet agent)  Generic Name (Brand): prasugrel (Effient), once daily medication  PURPOSE: You are taking this medication along with aspirin to lower your chance of having a heart attack, stroke, or blood clots in your heart stent. These can be fatal. Effient and aspirin help prevent platelets from sticking together and forming a clot that can block an artery or your stent.   Common SIDE EFFECTS you may experience include: bruising or bleeding more easily, shortness of breath  Do not stop taking EFFIENT without talking to the doctor who prescribes it for you. People who are treated with a stent and stop taking Effient too soon, have a higher risk of getting a blood clot in the stent, having a heart attack, or dying. If you stop Effient because of bleeding, or for other reasons, your risk of a heart attack or stroke may increase.   Avoid taking NSAID agents or anti-inflammatory medications such as ibuprofen, naproxen given increased bleed risk with Effient - can use acetaminophen (Tylenol) if needed for pain.  Tell all of your doctors and dentists that you are taking Effient. They should talk to the doctor who prescribed Effient for you before you have any surgery or invasive procedure.   Contact your health care provider if you experience: severe or uncontrollable bleeding, pink/red/Murry urine, vomiting blood or vomit that looks like "coffee grounds", red or black stools (looks like tar), coughing up blood or blood clots ----------------------------------------------------------------------------------------------------------------------

## 2023-09-10 NOTE — Plan of Care (Signed)

## 2023-09-12 LAB — LIPOPROTEIN A (LPA): Lipoprotein (a): <8.4 nmol/L (ref <75.0)

## 2023-09-13 ENCOUNTER — Telehealth (HOSPITAL_COMMUNITY): Payer: Self-pay | Admitting: *Deleted

## 2023-09-13 NOTE — Telephone Encounter (Signed)
 Cardiac Rehab Phase 2 referral faxed to [facility] per pt request. Ethelda Chick BS, ACSM-CEP 09/13/2023 1:37 PM

## 2023-09-15 MED FILL — Fentanyl Citrate Preservative Free (PF) Inj 100 MCG/2ML: INTRAMUSCULAR | Qty: 2 | Status: AC

## 2023-09-19 ENCOUNTER — Other Ambulatory Visit: Payer: Self-pay | Admitting: *Deleted

## 2023-09-19 ENCOUNTER — Telehealth: Payer: Self-pay | Admitting: *Deleted

## 2023-09-19 ENCOUNTER — Encounter: Payer: Self-pay | Admitting: Nurse Practitioner

## 2023-09-19 ENCOUNTER — Ambulatory Visit: Payer: 59 | Attending: Nurse Practitioner | Admitting: Nurse Practitioner

## 2023-09-19 VITALS — BP 132/82 | HR 71 | Ht 77.0 in | Wt 351.4 lb

## 2023-09-19 DIAGNOSIS — I2510 Atherosclerotic heart disease of native coronary artery without angina pectoris: Secondary | ICD-10-CM | POA: Diagnosis not present

## 2023-09-19 DIAGNOSIS — R0683 Snoring: Secondary | ICD-10-CM

## 2023-09-19 DIAGNOSIS — I10 Essential (primary) hypertension: Secondary | ICD-10-CM | POA: Diagnosis not present

## 2023-09-19 DIAGNOSIS — I2109 ST elevation (STEMI) myocardial infarction involving other coronary artery of anterior wall: Secondary | ICD-10-CM | POA: Diagnosis not present

## 2023-09-19 DIAGNOSIS — I255 Ischemic cardiomyopathy: Secondary | ICD-10-CM

## 2023-09-19 DIAGNOSIS — E785 Hyperlipidemia, unspecified: Secondary | ICD-10-CM

## 2023-09-19 MED ORDER — SPIRONOLACTONE 25 MG PO TABS
12.5000 mg | ORAL_TABLET | Freq: Every day | ORAL | 3 refills | Status: DC
Start: 2023-09-19 — End: 2023-10-07

## 2023-09-19 NOTE — Progress Notes (Signed)
 Cardiology Office Note:  .   Date:  09/19/2023  ID:  Mark Barrett, DOB 07/27/1980, MRN [id number] PCP: Marianne Sofia, MD  [facility] Providers Cardiologist:  Yates Decamp, MD    Patient Profile: .      PMH Coronary artery disease S/p acute anterolateral STEMI [facility] 09/09/23 Ulcerated high-grade 99% stenosis in proximal LAD at the origin of SP1 >> PTCA/DES with 4.5 x 16 mm Synergy XD OM 2 20-30% Due to edge resection 4.0 x 8 mm Synergy XD was deployed in the distal end Large dominant RCA 10-20% in prox to mid segment Ischemic cardiomyopathy 2D echo 09/10/23 LVEF 40-45%, apical lateral segment, apical septal segment, apical anterior segment and apical inferior segment hypokinesis, apical wall motion abnormalities consistent with LAD disease, no LV thrombus, G1DD, normal RV Hyperlipidemia Migraines Family history early CAD Father had MI in his 71s Obesity OSA  Patient with no prior cardiac history, who presented 09/09/2023 to [facility] ED with crushing chest discomfort that started earlier the morning of admission.  EKG revealed ST elevation in V1 through V6 suggestive of acute anterior lateral STEMI and he was transferred to [facility] for emergent cardiac catheterization.  LHC revealed ulcerated 99% stenosis of proximal LAD treated with DES x 1 with edge dissection requiring additional DES.  No immediate postprocedural complications.  Recommendation for DAPT with aspirin/Effient for 1 year.  High-sensitivity troponin peaked at greater than 24,000.  Previous LDL in 2019 was 173.  Labs obtained during admission revealed total cholesterol 208, triglycerides 128, LDL 137, HDL 45, LP(a) negative.  He was initially started on metoprolol XL 50 mg daily but this was stopped in the setting of a cardiac index of 1.8.  Echo revealed LVEF 40 to 45%, G1 DD, hypokinesis of apical lateral/septal/anterior segments.  He was continued on losartan 25 mg daily and spironolactone 12.5 mg daily was  added.  He has been referred to Pharm.D. for lipid management.       History of Present Illness: .   Mark Barrett is a very pleasant 43 y.o. male who is here today with his wife for post STEMI follow-up.  He reports feeling better overall and experiencing deeper sleep since the procedure. History of mild to moderate sleep apnea, diagnosed in 2018 or 2019, which has not been treated. He reports some shortness of breath when walking up steep hills, but otherwise does not feel SOB during normal walking. He has been actively trying to lose weight and lost about 40 pounds last year, although he has gained back about 10 pounds since the holidays. At time of hospital admission, he was not on anti-hypertensive or lipid lowering agents.  He denies chest pain, palpitations, edema, orthopnea, PND, presyncope, or syncope.  Achieved weight loss by increasing his protein, decreasing his carbohydrates, and increasing his physical activity. He does not plan to attend cardiac rehab because he feels that he is already motivated to work out on his own.   Discussed the use of AI scribe software for clinical note transcription with the patient, who gave verbal consent to proceed.   ROS: See HPI       Studies Reviewed: Marland Kitchen   EKG Interpretation Date/Time:  Thursday September 19 2023 08:46:40 EST Ventricular Rate:  71 PR Interval:  146 QRS Duration:  96 QT Interval:  400 QTC Calculation: 434 R Axis:   106  Text Interpretation: Normal sinus rhythm Rightward axis Incomplete right bundle branch block Septal infarct (cited on or before  09-Sep-2023) T wave abnormality, consider anterolateral ischemia When compared with ECG of 10-Sep-2023 05:47, No significant change was found Confirmed by Eligha Bridegroom [phone]) on 09/19/2023 8:59:53 AM    Risk Assessment/Calculations:         STOP-Bang Score:  7      Physical Exam:   VS:  BP 132/82   Pulse 71   Ht 6\' 5"  (1.956 m)   Wt (!) 351 lb 6.4 oz (159.4 kg)   SpO2 95%   BMI  41.67 kg/m    Wt Readings from Last 3 Encounters:  09/19/23 (!) 351 lb 6.4 oz (159.4 kg)  09/09/23 (!) 335 lb (152 kg)  06/18/18 (!) 316 lb (143.3 kg)    GEN: Obese, well developed in no acute distress NECK: No JVD; No carotid bruits CARDIAC: RRR, no murmurs, rubs, gallops RESPIRATORY:  Clear to auscultation without rales, wheezing or rhonchi  ABDOMEN: Soft, non-tender, non-distended EXTREMITIES:  Right radial and left brachial cath sites are well healed. No edema; No deformity     ASSESSMENT AND PLAN: .    CAD s/p acute anterolateral STEMI: He presented with chest pain 09/09/23 with ST elevation in anterior precordial leads consistent with STEMI and subsequent LHC that revealed 99% ulcerated stenosis in proximal LAD treated with 4.5 x 16 mm stent and 4.0 x 8 mm stent due to edge dissection.  Plan for DAPT for 1 year with prasugrel and aspirin. He is tolerating medications well with no concerning side effects. We discussed consideration in the future for discontinuation of one anti-platelet agent will need to be determined by primary cardiologist.  Lengthy discussion about secondary prevention including heart healthy diet avoiding saturated fat, processed foods, simple carbohydrates, and sugar and aiming to achieve at least 150 minutes of moderate intensity exercise each week.  ICM: Echo 09/10/23 revealed mildly reduced LVEF 40 to 45%, G1 DD, wall motion abnormalities consistent with LAD disease. I reviewed these findings in detail.  Base weight at home is 341 lb and is stable.  Body habitus makes volume status difficult to assess. He has SOB walking up an incline but this is not significantly worse post MI. No orthopnea, PND, or significant edema.  Discussed the importance of GDMT which she is tolerating well.  We will continue losartan and spironolactone. Renal function stable on labs completed 09/10/23. Avoiding metoprolol in the setting of low cardiac index. Low sodium diet advised.  In 1 week. We  will get BMET.   Hyperlipidemia LDL goal < 55: Negative LP(a). Lipid panel completed 09/10/23: total cholesterol 208, triglycerides 128, HDL 45, LDL 137.  He was started on atorvastatin 80 mg recently. Lifestyle modification emphasized. Brief discussion about additional agents that may be needed in the future if LDL does not reach goal of < 55. We will recheck this in 2 to 3 months    Hypertension: BP is well controlled.  Continue losartan and spironolactone. We will repeat BMET in 1 week.  Snoring/OSA: STOP BANG score of 7. We will order home sleep study.   Obesity: He is motivated to work on weight loss on his own through lifestyle modification.  He previously achieved 40 pound weight loss on increasing protein intake and decreasing carbohydrate intake and exercising more.  He states he is particularly active during the warmer months. We discussed nutrition and activity guidelines and information was provided.        Disposition: 3 months with Dr. Jacinto Halim  Signed, Eligha Bridegroom, NP-C

## 2023-09-19 NOTE — Telephone Encounter (Signed)
 S/w pt due to pt needed a BMET today at ov due to new start of medications in the hospital. Marcelino Duster placed order, released and [facility] will [facility] [place] in the next several days.

## 2023-09-19 NOTE — Patient Instructions (Signed)
 Medication Instructions:   Your physician recommends that you continue on your current medications as directed. Please refer to the Current Medication list given to you today.   *If you need a refill on your cardiac medications before your next appointment, please call your pharmacy*   Lab Work:  None ordered.  If you have labs (blood work) drawn today and your tests are completely normal, you will receive your results only by: MyChart Message (if you have MyChart) OR A paper copy in the mail If you have any lab test that is abnormal or we need to change your treatment, we will call you to review the results.   Testing/Procedures:  Your physician has recommended that you have a sleep study. This test records several body functions during sleep, including: brain activity, eye movement, oxygen and carbon dioxide blood levels, heart rate and rhythm, breathing rate and rhythm, the flow of air through your mouth and nose, snoring, body muscle movements, and chest and belly movement.    Follow-Up: At [facility], you and your health needs are our priority.  As part of our continuing mission to provide you with exceptional heart care, we have created designated Provider Care Teams.  These Care Teams include your primary Cardiologist (physician) and Advanced Practice Providers (APPs -  Physician Assistants and Nurse Practitioners) who all work together to provide you with the care you need, when you need it.  We recommend signing up for the patient portal called "MyChart".  Sign up information is provided on this After Visit Summary.  MyChart is used to connect with patients for Virtual Visits (Telemedicine).  Patients are able to view lab/test results, encounter notes, upcoming appointments, etc.  Non-urgent messages can be sent to your provider as well.   To learn more about what you can do with MyChart, go to [email or web address].    Your next appointment:   3  month(s)  Provider:   Yates Decamp, MD     Other Instructions   1st Floor: - Lobby - Registration  - Pharmacy  - Lab - Cafe  2nd Floor: - PV Lab - Diagnostic Testing (echo, CT, nuclear med)  3rd Floor: - Vacant  4th Floor: - TCTS (cardiothoracic surgery) - AFib Clinic - Structural Heart Clinic - Vascular Surgery  - Vascular Ultrasound  5th Floor: - HeartCare Cardiology (general and EP) - Clinical Pharmacy for coumadin, hypertension, lipid, weight-loss medications, and med management appointments    Valet parking services will be available as well.    Adopting a Healthy Lifestyle.   Weight: Know what a healthy weight is for you (roughly BMI <25) and aim to maintain this. You can calculate your body mass index on your smart phone. Unfortunately, this is not the most accurate measure of healthy weight, but it is the simplest measurement to use. A more accurate measurement involves body scanning which measures lean muscle, fat tissue and bony density. We do not have this equipment at [facility].    Diet: Aim for 7+ servings of fruits and vegetables daily Limit animal fats in diet for cholesterol and heart health - choose grass fed whenever available Avoid highly processed foods (fast food burgers, tacos, fried chicken, pizza, hot dogs, french fries)  Saturated fat comes in the form of butter, lard, coconut oil, margarine, partially hydrogenated oils, and fat in meat. These increase your risk of cardiovascular disease.  Use healthy plant oils, such as olive, canola, soy, corn, sunflower and peanut.  Whole foods such as  fruits, vegetables and whole grains have fiber  Men need > 38 grams of fiber per day Women need > 25 grams of fiber per day  Load up on vegetables and fruits - one-half of your plate: Aim for color and variety, and remember that potatoes dont count. Go for whole grains - one-quarter of your plate: Whole wheat, barley, wheat berries, quinoa, oats, brown rice,  and foods made with them. If you want pasta, go with whole wheat pasta. Protein power - one-quarter of your plate: Fish, chicken, beans, and nuts are all healthy, versatile protein sources. Limit red meat. You need carbohydrates for energy! The type of carbohydrate is more important than the amount. Choose carbohydrates such as vegetables, fruits, whole grains, beans, and nuts in the place of white rice, white pasta, potatoes (baked or fried), macaroni and cheese, cakes, cookies, and donuts.  If youre thirsty, drink water. Coffee and tea are good in moderation, but skip sugary drinks and limit milk and dairy products to one or two daily servings. Keep sugar intake at 6 teaspoons or 24 grams or LESS       Exercise: Aim for 150 min of moderate intensity exercise weekly for heart health, and weights twice weekly for bone health Stay active - any steps are better than no steps! Aim for 7-9 hours of sleep daily          Mediterranean Diet  Why follow it? Research shows. Those who follow the Mediterranean diet have a reduced risk of heart disease  The diet is associated with a reduced incidence of Parkinson's and Alzheimer's diseases People following the diet may have longer life expectancies and lower rates of chronic diseases  The Dietary Guidelines for Americans recommends the Mediterranean diet as an eating plan to promote health and prevent disease  What Is the Mediterranean Diet?  Healthy eating plan based on typical foods and recipes of Mediterranean-style cooking The diet is primarily a plant based diet; these foods should make up a majority of meals   Starches - Plant based foods should make up a majority of meals - They are an important sources of vitamins, minerals, energy, antioxidants, and fiber - Choose whole grains, foods high in fiber and minimally processed items  - Typical grain sources include wheat, oats, barley, corn, brown rice, bulgar, farro, millet, polenta, couscous   - Various types of beans include chickpeas, lentils, fava beans, black beans, white beans   Fruits  Veggies - Large quantities of antioxidant rich fruits & veggies; 6 or more servings  - Vegetables can be eaten raw or lightly drizzled with oil and cooked  - Vegetables common to the traditional Mediterranean Diet include: artichokes, arugula, beets, broccoli, brussel sprouts, cabbage, carrots, celery, collard greens, cucumbers, eggplant, kale, leeks, lemons, lettuce, mushrooms, okra, onions, peas, peppers, potatoes, pumpkin, radishes, rutabaga, shallots, spinach, sweet potatoes, turnips, zucchini - Fruits common to the Mediterranean Diet include: apples, apricots, avocados, cherries, clementines, dates, figs, grapefruits, grapes, melons, nectarines, oranges, peaches, pears, pomegranates, strawberries, tangerines  Fats - Replace butter and margarine with healthy oils, such as olive oil, canola oil, and tahini  - Limit nuts to no more than a handful a day  - Nuts include walnuts, almonds, pecans, pistachios, pine nuts  - Limit or avoid candied, honey roasted or heavily salted nuts - Olives are central to the Mediterranean diet - can be eaten whole or used in a variety of dishes   Meats Protein - Limiting red meat: no more than  a few times a month - When eating red meat: choose lean cuts and keep the portion to the size of deck of cards - Eggs: approx. 0 to 4 times a week  - Fish and lean poultry: at least 2 a week  - Healthy protein sources include, chicken, [place], lean beef, lamb - Increase intake of seafood such as tuna, salmon, trout, mackerel, shrimp, scallops - Avoid or limit high fat processed meats such as sausage and bacon  Dairy - Include moderate amounts of low fat dairy products  - Focus on healthy dairy such as fat free yogurt, skim milk, low or reduced fat cheese - Limit dairy products higher in fat such as whole or 2% milk, cheese, ice cream  Alcohol - Moderate amounts of red wine  is ok  - No more than 5 oz daily for women (all ages) and men older than age 40  - No more than 10 oz of wine daily for men younger than 65  Other - Limit sweets and other desserts  - Use herbs and spices instead of salt to flavor foods  - Herbs and spices common to the traditional Mediterranean Diet include: basil, bay leaves, chives, cloves, cumin, fennel, garlic, lavender, marjoram, mint, oregano, parsley, pepper, rosemary, sage, savory, sumac, tarragon, thyme   It's not just a diet, it's a lifestyle:  The Mediterranean diet includes lifestyle factors typical of those in the region  Foods, drinks and meals are best eaten with others and savored Daily physical activity is important for overall good health This could be strenuous exercise like running and aerobics This could also be more leisurely activities such as walking, housework, yard-work, or taking the stairs Moderation is the key; a balanced and healthy diet accommodates most foods and drinks Consider portion sizes and frequency of consumption of certain foods   Meal Ideas & Options:  Breakfast:  Whole wheat toast or whole wheat English muffins with peanut butter & hard boiled egg Steel cut oats topped with apples & cinnamon and skim milk  Fresh fruit: banana, strawberries, melon, berries, peaches  Smoothies: strawberries, bananas, greek yogurt, peanut butter Low fat greek yogurt with blueberries and granola  Egg white omelet with spinach and mushrooms Breakfast couscous: whole wheat couscous, apricots, skim milk, cranberries  Sandwiches:  Hummus and grilled vegetables (peppers, zucchini, squash) on whole wheat bread   Grilled chicken on whole wheat pita with lettuce, tomatoes, cucumbers or tzatziki  [place] salad on whole wheat bread: tuna salad made with greek yogurt, olives, red peppers, capers, green onions Garlic rosemary lamb pita: lamb sauted with garlic, rosemary, salt & pepper; add lettuce, cucumber, greek yogurt to pita  - flavor with lemon juice and black pepper  Seafood:  Mediterranean grilled salmon, seasoned with garlic, basil, parsley, lemon juice and black pepper Shrimp, lemon, and spinach whole-grain pasta salad made with low fat greek yogurt  Seared scallops with lemon orzo  Seared tuna steaks seasoned salt, pepper, coriander topped with tomato mixture of olives, tomatoes, olive oil, minced garlic, parsley, green onions and cappers  Meats:  Herbed greek chicken salad with kalamata olives, cucumber, feta  Red bell peppers stuffed with spinach, bulgur, lean ground beef (or lentils) & topped with feta   Kebabs: skewers of chicken, tomatoes, onions, zucchini, squash  [place] burgers: made with red onions, mint, dill, lemon juice, feta cheese topped with roasted red peppers Vegetarian Cucumber salad: cucumbers, artichoke hearts, celery, red onion, feta cheese, tossed in olive oil & lemon juice  Hummus and whole grain pita points with a greek salad (lettuce, tomato, feta, olives, cucumbers, red onion) Lentil soup with celery, carrots made with vegetable broth, garlic, salt and pepper  Tabouli salad: parsley, bulgur, mint, scallions, cucumbers, tomato, radishes, lemon juice, olive oil, salt and pepper.  Tackling Obesity with Lifestyle Changes  Obesity- What is it? And What can we do about it?  Obesity is a chronic complex disease defined as excessive fat deposits that can have a negative effect on our health. It can lead to many other diseases including type 2 diabetes.  Weight gain occurs when the amount of energy (calories) we consume is greater than the amount we use.  When our energy output is greater than our energy input we lose weight. The basic concept is simple, but in reality, it's much more complicated.  Unfortunately, in some people, our bodies have many ways it can compensate when we try to eat less and move more which can prevent [place] from changing our weight. This can lead to some people having  a much more difficult time losing weight even when they put healthy habits into practice. This can be frustrating. We want to focus on healthy habits, physical activity and how we feel, and less the number on the scale.  Food As Energy  Calories  Calories is just a unit of measurement for energy.  Counting calories is not required to lose weight but counting for a short period of time can:   help you learn good portion sizes   Learn what your true energy needs are.   Help you be more aware of your snacking or grazing habits  To help calculate how many calories you should be eating, the NIH has a great body weight planner calculator at BeverageBuggy.si  Types of Energy Expenditure  Basal Metabolic Rate (BMR) Energy that our bodies use to preform everyday tasks. More muscle mass through resistance training can increase this a small amount  Thermic Effect of Food The amount of energy that it takes to breakdown the food we eat. This will be highest when we eat protein and fiber rich foods  Exercise Energy Expenditure The amount of energy used during formal exercise (walking, biking, weightlifting)  Non-exercise activity thermogenesis (NEAT) The amount of energy spent on activities that are not formal exercise (standing, fidgeting). Therefore, it is not only important to do formal exercise but also move around throughout the day.  Managing The Meal  Macro nutrients (carbohydrates, fats and protein, fiber, water)  Micronutrients (vitamins, minerals)  Dietary Fiber  Benefits Examples Cautions  Soluble fiber  Decreases cholesterol  improve blood sugar control,  Feeds our gut bacteria  Allows [place] to feel fuller for longer so we eat less  fruits  oats  barley  legumes  peas  Beans  vegetables (broccoli) and root vegetables (carrots) Add fiber into your diet slowly and be sure to drink at least 8 cups of water a day. This will help limit gas, bloating, diarrhea, or  constipation.  Insoluble Fiber  Improves digestive health by making stool easier to pass  Allows [place] to feel fuller for longer so we eat less  whole grains  nuts  seeds  skin of fruit  vegetables (green beans, zucchini, cauliflower)  Tricks to add more fiber to your diet   Add beans (pinto, kidney, lima, navy and garbanzo) to salads, ground meat or brown rice   Add nuts or seeds and or fresh/frozen fruit to yogurt, cottage cheese, salads  or steel cut oats   Cut up vegetables and eat with hummus   Look for unsweetened whole grain cereals with at least 5g of fiber per serving   Switch to whole grain bread. Look for bread that has whole grain flour as the first ingredient and has more fiber than carbs if you were to multiple the fiber x 10.   Try bulgar, barely, quinoa, buckwheat, brown rice wild rice instead of white rice   Keep frozen vegetables on hand to add to dishes or soups  Meal Planning:  Meal planning is the key to setting you up for success. Here are some examples of healthy meal options.  Breakfast  Option 1: Omelette with vegetables (1 egg, spinach, mushrooms, or other vegetable of your choice), 2 slices whole-grain toast, tip of thumb size butter or soft margarine,  cup low-fat milk or yogurt  Option 2: steel-cut rolled oats (? cup dry), 1 tbsp peanut butter added to cooked oats,  cup low-fat milk.  Option 3: 2 slices whole-grain or rye toast with avocado spread ( small avocado mased with herbs and pepper to taste), 1 poached egg or [place] up (cooked to your liking)  Option 4:  cup plain 0% [place] yogurt topped with  cup berries and  cup walnuts or almonds, 2 slices whole-grain or rye toast, tip of thumb size soft margarine/butter  Lunch:  Option 1: 2 cups red lentil soup, green salad with 1 tbsp homemade vinaigrette (extra virgin olive oil and vinegar of choice plus spices)  Option 2: 3 oz. roasted chicken, 2 slices whole-grain bread, 2 tsp  mayonnaise, mustard, lettuce, tomato if desired, 1 fruit (example: medium-sized apple or small pear)  Option 3: 3 oz. tuna packed in water, 1 whole-wheat pita (6 inch), 2 tsp mayonnaise, lettuce, tomato, or other non-starchy vegetable of your choice, 1 fruit (example: medium-sized apple or small pear)  Option 4: 1 serving of garden veggie buddha bowl with lentils and tahini sauce and 1 cup berries topped with  cup plain 0% Greek yogurt  Dinner:  Option 1: 1 serving roasted cauliflower salad, 3-4 oz. grilled or baked pork loin chop, 1/2 cup mashed potato, or brown rice or quinoa  Option 2: 1 serving fish (baked, grilled or air fried), green salad, 1 tbsp homemade vinaigrette,  cup cooked couscous  Option 3: 1 cup cooked whole grained pasta (example: spaghetti, spirals, macaroni),  cup favorite pasta sauce (preferably homemade), 3-4 oz. grilled or baked chicken, green salad, 1 tbsp homemade vinaigrette  Option 4: 1 serving oven roasted salmon,  cup mashed sweet potato or couscous or brown rice or quinoa, broccoli (steamed or roasted)  Healthy snacks:   Carrots or celery with 1 tbsp of hummus   1 medium-sized fruit (apple or orange)   1 cup plain 0% [place] yogurt with  cup berries   Half apple, sliced, with 1 tbsp (15 mL) peanut or almond butter  Dining out:  Eating away from home has become a part of many people's lifestyle. Making healthy choices when you are eating out is important too. Portion size is an important part of healthy choices. Most branded fast-food places provide calories, sodium, and fat content for their menu items. www.calorieking.com would be great resource to find nutrition facts for your favorite brands and fast-food restaurants. Company specific website can be Chief Technology Officer for nutrition information for their items. (e.g. www.mcdonalds.com or www.nutritionix.com/[place]/menu/premium)  Here are some tips to help you make wise food choices when you are  dining out.  Chose more often Avoid  Beverages   Choose more often: Water, low fat milk  Sugar-free/diet drinks  Unsweet tea or coffee    Avoid: Milkshakes, fruit drinks, regular pop  Alcohol, specialty drinks (e.g. iced cappuccino)  Fast food  Choose more often:  Garden salad  Mini subs, pita sandwiches ect with extra vegetables  plain burgers, grilled chicken  Vegetarian or cheese pizza with whole-grain crust    Avoid: Burgers/sandwiches with bacon, cheese, and high-fat sauces  [place] fries, fried chicken, fried fish, poutine, hash browns  Pizza with processed meats  Starters   Choose more often: Raw vegetables, salads (garden, spinach, fruit)  clear or vegetable soups  Seafood cocktail  Whole-grain breads and rolls    Avoid: Salads with high-fat dressings or toppings  Creamy soups  Wings, egg rolls  onion rings, nachos  White or garlic bread  Main courses Grains & Starches (amount equal to  of your plate)  Choose more often:  Oatmeal, high-fiber/lower-sugar cereals  Whole-grain breads, rice, pasta, barley, couscous  Sweet potatoes    Avoid: Sugary, low-fiber cereals  Large bagels, muffins, croissants, white bread  [place] fries, hash browns, fried rice   Meat and alternative (amount equal to  of your plate)  Choose more often:  Lean meats, poultry, fish, eggs, low-fat cheese  Tofu, vegetable protein Legumes (e.g. lentils, chickpeas, beans)    Avoid: High-salt and/or high-fat meats (e.g. ribs, wings, sausages, wieners, processed lunch meats, imposter meats)   Vegetables (amount equal to  of your plate)  Choose more often:  Salads ([place], garden, spinach), plain vegetables   Avoid:  Salads with creamy, high-fat dressings and   Vegetables on sandwiches ect toppings like bacon bits, croutons, cheese  Desserts  Choose more often:  Fresh fruit, frozen yogourt, skim milk latte    Avoid: Cakes, pies, pastries, ice cream, cheesecake

## 2023-09-19 NOTE — Telephone Encounter (Signed)
 Patient agreement reviewed and signed on 09/19/2023.  WatchPAT issued to patient on 09/19/2023 by Elliot Cousin, CMA. Patient aware to not open the WatchPAT box until contacted with the activation PIN. Patient profile initialized in CloudPAT on 09/19/2023 by Anselm Pancoast. Device serial number: [id number]  Please list Reason for Call as Advice Only and type "WatchPAT issued to patient" in the comment box.

## 2023-09-21 LAB — BASIC METABOLIC PANEL
BUN/Creatinine Ratio: 18 (ref 9–20)
BUN: 15 mg/dL (ref 6–24)
CO2: 20 mmol/L (ref 20–29)
Calcium: 9.2 mg/dL (ref 8.7–10.2)
Chloride: 104 mmol/L (ref 96–106)
Creatinine, Ser: 0.82 mg/dL (ref 0.76–1.27)
Glucose: 115 mg/dL — ABNORMAL HIGH (ref 70–99)
Potassium: 4.3 mmol/L (ref 3.5–5.2)
Sodium: 139 mmol/L (ref 134–144)
eGFR: 113 mL/min/{1.73_m2} (ref >59)

## 2023-09-30 ENCOUNTER — Telehealth: Payer: Self-pay | Admitting: *Deleted

## 2023-09-30 NOTE — Telephone Encounter (Signed)
 Per operator Nada Libman, pt called in asking for OIN# for Itamar. I do not see that the pt has been approved yet. I will send a note to the sleep coordinator.

## 2023-09-30 NOTE — Telephone Encounter (Signed)
**Note De-Identified Twylla Arceneaux Obfuscation** Ordering provider: Eligha Bridegroom, NP Associated diagnoses: Snoring-R06.83 and Obesity-E66.01  WatchPAT PA obtained on 09/30/2023 by Blonnie Maske, Lorelle Formosa, LPN. Authorization: Per the [facility] Provider Portal: Procedure code 16109 (Itamar-HST) Description Sleep study, unattended, simultaneous recording; heart rate, oxygen saturation, respiratory analysis (eg, by airflow or peripheral arterial tone), and sleep time Inquiry summary Notification/Prior Authorization not required for this service.  Patient notified of PIN ([id number]) on 09/30/2023 Keyshawn Hellwig Notification Method: phone.  Phone note routed to covering staff for follow-up.

## 2023-10-03 ENCOUNTER — Other Ambulatory Visit (HOSPITAL_COMMUNITY): Payer: Self-pay

## 2023-10-07 ENCOUNTER — Telehealth: Payer: Self-pay | Admitting: Nurse Practitioner

## 2023-10-07 ENCOUNTER — Other Ambulatory Visit (HOSPITAL_COMMUNITY): Payer: Self-pay

## 2023-10-07 MED ORDER — SPIRONOLACTONE 25 MG PO TABS: 12.5000 mg | ORAL_TABLET | Freq: Every day | ORAL | 3 refills | Status: AC

## 2023-10-07 NOTE — Telephone Encounter (Signed)
*  STAT* If patient is at the pharmacy, call can be transferred to refill team.   1. Which medications need to be refilled? (please list name of each medication and dose if known) new prescription for Spironolactone   2. Would you like to learn more about the convenience, safety, & potential cost savings by using the [facility]?    3. Are you open to using the [facility] (Type [facility].   4. Which pharmacy/location (including street and city if local pharmacy) is medication to be sent to?   [facility] [place]   5. Do they need a 30 day or 90 day supply?  90 days and refills-

## 2023-10-07 NOTE — Telephone Encounter (Signed)
 Pt's medication was sent to pt's pharmacy as requested. Confirmation received.

## 2023-10-09 ENCOUNTER — Encounter (INDEPENDENT_AMBULATORY_CARE_PROVIDER_SITE_OTHER): Admitting: Cardiology

## 2023-10-09 DIAGNOSIS — G4733 Obstructive sleep apnea (adult) (pediatric): Secondary | ICD-10-CM

## 2023-10-15 ENCOUNTER — Ambulatory Visit: Attending: Nurse Practitioner

## 2023-10-15 DIAGNOSIS — I2109 ST elevation (STEMI) myocardial infarction involving other coronary artery of anterior wall: Secondary | ICD-10-CM

## 2023-10-15 DIAGNOSIS — I10 Essential (primary) hypertension: Secondary | ICD-10-CM

## 2023-10-15 DIAGNOSIS — R0683 Snoring: Secondary | ICD-10-CM

## 2023-10-15 NOTE — Telephone Encounter (Signed)
 S/w pt to see if pt wore sleep watch pat.  Pt stated wore sleep test last week and was notified by mychart today. Stated sleep team will reach out to you to notify you to follow up on results.

## 2023-10-15 NOTE — Procedures (Signed)
   SLEEP STUDY REPORT Patient Information Study Date: 10/09/2023 Patient Name: Mark Barrett Patient ID: [id number] Birth Date: 1980-10-08 Age: 43 Gender: Male BMI: 41.4 (W=350 lb, H=6' 5'') Stopbang: 7 Referring Physician: Eligha Bridegroom, NP  TEST DESCRIPTION: Home sleep apnea testing was completed using the WatchPat, a Type 1 device, utilizing  peripheral arterial tonometry (PAT), chest movement, actigraphy, pulse oximetry, pulse rate, body position and snore.  AHI was calculated with apnea and hypopnea using valid sleep time as the denominator. RDI includes apneas,  hypopneas, and RERAs. The data acquired and the scoring of sleep and all associated events were performed in  accordance with the recommended standards and specifications as outlined in the AASM Manual for the Scoring of  Sleep and Associated Events 2.2.0 (2015).  FINDINGS:  1. Severe Obstructive Sleep Apnea with AHI 59.2/hr.   2. Mild Central Sleep Apnea with pAHIc 16.2/hr.  3. Oxygen desaturations as low as 75%.  4. Severe snoring was present. O2 sats were < 88% for 132 min.  5. Total sleep time was 6 hrs and 40 min.  6. 18.9% of total sleep time was spent in REM sleep.   7. Normal sleep onset latency at 20 min.   8. Prolonged REM sleep onset latency at 191 min.   9. Total awakenings were 6.  10. Arrhythmia detection: None  DIAGNOSIS:  Severe Obstructive Sleep Apnea (G47.33) Nocturnal Hypoxemia  RECOMMENDATIONS: 1. Clinical correlation of these findings is necessary. The decision to treat obstructive sleep apnea (OSA) is usually  based on the presence of apnea symptoms or the presence of associated medical conditions such as Hypertension,  Congestive Heart Failure, Atrial Fibrillation or Obesity. The most common symptoms of OSA are snoring, gasping for  breath while sleeping, daytime sleepiness and fatigue.   2. Initiating apnea therapy is recommended given the presence of symptoms and/or associated  conditions.  Recommend proceeding with one of the following:   a. Auto-CPAP therapy with a pressure range of 5-20cm H2O.   b. An oral appliance (OA) that can be obtained from certain dentists with expertise in sleep medicine. These are  primarily of use in non-obese patients with mild and moderate disease.   c. An ENT consultation which may be useful to look for specific causes of obstruction and possible treatment  options.   d. If patient is intolerant to PAP therapy, consider referral to ENT for evaluation for hypoglossal nerve stimulator.   3. Close follow-up is necessary to ensure success with CPAP or oral appliance therapy for maximum benefit .  4. A follow-up oximetry study on CPAP is recommended to assess the adequacy of therapy and determine the need  for supplemental oxygen or the potential need for Bi-level therapy. An arterial blood gas to determine the adequacy of  baseline ventilation and oxygenation should also be considered.  5. Healthy sleep recommendations include: adequate nightly sleep (normal 7-9 hrs/night), avoidance of caffeine after  noon and alcohol near bedtime, and maintaining a sleep environment that is cool, dark and quiet.  6. Weight loss for overweight patients is recommended. Even modest amounts of weight loss can significantly  improve the severity of sleep apnea.  7. Snoring recommendations include: weight loss where appropriate, side sleeping, and avoidance of alcohol before  bed.  8. Operation of motor vehicle should be avoided when sleepy.  Signature: Armanda Magic, MD; [facility]; Diplomat, American Board of Sleep  Medicine Electronically Signed: 10/15/2023 10:05:53 AM

## 2023-10-18 ENCOUNTER — Telehealth: Payer: Self-pay | Admitting: *Deleted

## 2023-10-18 DIAGNOSIS — G4733 Obstructive sleep apnea (adult) (pediatric): Secondary | ICD-10-CM

## 2023-10-18 DIAGNOSIS — R0683 Snoring: Secondary | ICD-10-CM

## 2023-10-18 NOTE — Telephone Encounter (Signed)
 The patient has been notified of the result and verbalized understanding.  All questions (if any) were answered. Latrelle Dodrill, CMA 10/18/2023 2:53 PM    WILL PRECERT TITRATION

## 2023-10-18 NOTE — Telephone Encounter (Signed)
-----   Message from Armanda Magic sent at 10/15/2023 10:07 AM EDT ----- Please let patient know that they have sleep apnea.  Recommend therapeutic CPAP titration for treatment of patient's sleep disordered breathing.

## 2023-10-18 NOTE — Telephone Encounter (Signed)
 PENDED-Decision ID #: [id number] # [id number]

## 2023-11-05 ENCOUNTER — Encounter (HOSPITAL_BASED_OUTPATIENT_CLINIC_OR_DEPARTMENT_OTHER): Payer: Self-pay

## 2023-11-05 NOTE — Telephone Encounter (Signed)
 Please review and advise.

## 2023-11-28 NOTE — Telephone Encounter (Signed)
 Prior Authorization for TITRATION sent to [facility] via web portal. Tracking Number . This case has been canceled

## 2023-12-03 ENCOUNTER — Telehealth: Payer: Self-pay | Admitting: Cardiology

## 2023-12-03 ENCOUNTER — Ambulatory Visit: Payer: 59 | Attending: Cardiology | Admitting: Cardiology

## 2023-12-03 ENCOUNTER — Encounter: Payer: Self-pay | Admitting: Cardiology

## 2023-12-03 VITALS — BP 128/72 | HR 64 | Resp 16 | Ht 77.0 in | Wt 351.5 lb

## 2023-12-03 DIAGNOSIS — I2510 Atherosclerotic heart disease of native coronary artery without angina pectoris: Secondary | ICD-10-CM | POA: Diagnosis not present

## 2023-12-03 DIAGNOSIS — E785 Hyperlipidemia, unspecified: Secondary | ICD-10-CM

## 2023-12-03 DIAGNOSIS — I10 Essential (primary) hypertension: Secondary | ICD-10-CM

## 2023-12-03 DIAGNOSIS — E66813 Obesity, class 3: Secondary | ICD-10-CM

## 2023-12-03 DIAGNOSIS — Z6841 Body Mass Index (BMI) 40.0 and over, adult: Secondary | ICD-10-CM

## 2023-12-03 DIAGNOSIS — I255 Ischemic cardiomyopathy: Secondary | ICD-10-CM

## 2023-12-03 NOTE — Telephone Encounter (Signed)
 Cpap titration has been approved will notify the sleep lab.

## 2023-12-03 NOTE — Telephone Encounter (Signed)
 Spoke to [place], patients CPAP titration is still pending. She is resubmitting and will f/u with patient.

## 2023-12-03 NOTE — Telephone Encounter (Signed)
 Prior Authorization for TITRATION sent to [employer] via web portal. Tracking Number . READY-APPROVED-Decision ID #: [id number]

## 2023-12-03 NOTE — Telephone Encounter (Signed)
-----   Message from Beatty B sent at 12/03/2023  9:46 AM EDT ----- Regarding: Dr. Janeice Medal Good morning,   Please schedule this pt with Dr. Micael Adas for Sleep Apnea per Dr. Berry Bristol.  Thanks, JB, 12-03-23

## 2023-12-03 NOTE — Progress Notes (Signed)
 Cardiology Office Note:  .   Date:  12/03/2023  ID:  Mark Barrett, DOB Dec 28, 1980, MRN [id number] PCP: Barbar Bonus, MD  [facility] Providers Cardiologist:  Knox Perl, MD   History of Present Illness: .   Mark Barrett is a 43 y.o. Caucasian male patient with severe OSA not on therapy, history of migraine headaches, well-controlled on NSAID, strong family history of premature coronary disease with her father having had coronary artery disease in his mid 42 years of age, presented with anterior STEMI on 09/09/2023 and underwent successful angioplasty and stenting to the proximal LAD and now presents for follow-up.  He was diagnosed with hypercholesterolemia and hypertension.  Discussed the use of AI scribe software for clinical note transcription with the patient, who gave verbal consent to proceed.  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  He continues to endorse Roque Collar with weight loss.The at-home sleep study showed severe obstructive sleep apnea with an AHI of 59.2 and mild central sleep apnea with a PHIC of 16.2 per hour. Oxygen desaturation reached 75%. He has not yet had a follow-up overnight sleep study at a sleep center.  He experiences shortness of breath, especially on steep inclines, which has improved over the past four to six weeks with increased physical activity. He denies chest discomfort or pain.  He has a history of myocardial infarction with mildly reduced cardiac function. He lost about 30 pounds last year but has regained 8 to 10 pounds. He is interested in weight loss options.  Labs   Lab Results  Component Value Date   CHOL 208 (H) 09/10/2023   HDL 45 09/10/2023   LDLCALC 137 (H) 09/10/2023   TRIG 128 09/10/2023   CHOLHDL 4.6 09/10/2023   Lab Results  Component Value Date   NA 139 09/20/2023   K 4.3 09/20/2023   CO2 20  09/20/2023   GLUCOSE 115 (H) 09/20/2023   BUN 15 09/20/2023   CREATININE 0.82 09/20/2023   CALCIUM  9.2 09/20/2023   EGFR 113 09/20/2023   GFRNONAA >60 09/10/2023      Latest Ref Rng & Units 09/20/2023    8:11 AM 09/10/2023    5:30 AM 09/09/2023   11:19 AM  BMP  Glucose 70 - 99 mg/dL 425  956    BUN 6 - 24 mg/dL 15  12    Creatinine 3.87 - 1.27 mg/dL 5.64  3.32    BUN/Creat Ratio 9 - 20 18     Sodium 134 - 144 mmol/L 139  138  140   Potassium 3.5 - 5.2 mmol/L 4.3  3.9  3.7   Chloride 96 - 106 mmol/L 104  103    CO2 20 - 29 mmol/L 20  23    Calcium  8.7 - 10.2 mg/dL 9.2  9.3        Latest Ref Rng & Units 09/10/2023    5:30 AM 09/09/2023   11:19 AM 09/09/2023   11:13 AM  CBC  WBC 4.0 - 10.5 K/uL 12.3     Hemoglobin 13.0 - 17.0 g/dL 95.1  88.4  16.6   Hematocrit 39.0 - 52.0 % 43.8  41.0  43.0   Platelets 150 - 400 K/uL 247      Lab Results  Component Value Date   HGBA1C 5.8 (H) 05/19/2018    Lab Results  Component Value Date   TSH  3.910 05/19/2018    ROS  Review of Systems  Cardiovascular:  Negative for chest pain, dyspnea on exertion and leg swelling.  Respiratory:  Positive for snoring.     Physical Exam:   VS:  BP 128/72 (BP Location: Left Arm, Patient Position: Sitting, Cuff Size: Large)   Pulse 64   Resp 16   Ht 6\' 5"  (1.956 m)   Wt (!) 351 lb 8 oz (159.4 kg)   SpO2 98%   BMI 41.68 kg/m    Wt Readings from Last 3 Encounters:  12/03/23 (!) 351 lb 8 oz (159.4 kg)  09/19/23 (!) 351 lb 6.4 oz (159.4 kg)  09/09/23 (!) 335 lb (152 kg)    Physical Exam Constitutional:      Appearance: He is morbidly obese.  Neck:     Vascular: No carotid bruit or JVD.  Cardiovascular:     Rate and Rhythm: Normal rate and regular rhythm.     Pulses: Intact distal pulses.     Heart sounds: Normal heart sounds. No murmur heard.    No gallop.  Pulmonary:     Effort: Pulmonary effort is normal.     Breath sounds: Normal breath sounds.  Abdominal:     General: Bowel sounds  are normal.     Palpations: Abdomen is soft.  Musculoskeletal:     Right lower leg: No edema.     Left lower leg: No edema.    Studies Reviewed: Aaron Aas    CARDIAC CATHETERIZATION 09/09/2023  Stenting of the proximal LAD  with overlapping 4.5 x 16 mm  and a 4.0 x 8 mm Synergy XD.       ECHOCARDIOGRAM COMPLETE 09/10/2023  1. Left ventricular ejection fraction, by estimation, is 40 to 45%. The left ventricle has mildly decreased function. The left ventricle demonstrates regional wall motion abnormalities (see scoring diagram/findings for description). The left ventricular internal cavity size was mildly dilated. Left ventricular diastolic parameters are consistent with Grade I diastolic dysfunction (impaired relaxation). Apical wall motion abnormalities consistent with LAD disease. No LV thrombus noted. 2. Right ventricular systolic function is normal. The right ventricular size is normal.  EKG:    EKG 09/19/2023: Normal sinus rhythm at 78 bpm, right axis deviation, T wave abnormality, anteroseptal ischemia.  Medications and allergies    No Known Allergies   Current Outpatient Medications:    aspirin  81 MG chewable tablet, Chew 1 tablet (81 mg total) by mouth daily., Disp: 90 tablet, Rfl: 2   atorvastatin  (LIPITOR) 80 MG tablet, Take 1 tablet (80 mg total) by mouth daily., Disp: 90 tablet, Rfl: 1   calcium  carbonate (TUMS - DOSED IN MG ELEMENTAL CALCIUM ) 500 MG chewable tablet, Chew 1-2 tablets by mouth daily as needed for indigestion or heartburn., Disp: , Rfl:    cetirizine (ZYRTEC) 10 MG tablet, Take 10 mg by mouth daily., Disp: , Rfl:    ELDERBERRY PO, Take 2 each by mouth daily after breakfast. May take an extra 2 gummies when feeling sick, Disp: , Rfl:    losartan  (COZAAR ) 50 MG tablet, Take 1 tablet (50 mg total) by mouth every evening., Disp: 90 tablet, Rfl: 1   Multiple Vitamins-Minerals (EMERGEN-C IMMUNE PLUS/VIT D) CHEW, Chew 2 each by mouth daily after breakfast. Gummies, Disp: ,  Rfl:    nitroGLYCERIN  (NITROSTAT ) 0.4 MG SL tablet, Place 1 tablet (0.4 mg total) under the tongue every 5 (five) minutes as needed., Disp: 25 tablet, Rfl: 2   prasugrel  (EFFIENT ) 10 MG TABS  tablet, Take 1 tablet (10 mg total) by mouth daily., Disp: 90 tablet, Rfl: 2   spironolactone  (ALDACTONE ) 25 MG tablet, Take 0.5 tablets (12.5 mg total) by mouth daily., Disp: 45 tablet, Rfl: 3   UNABLE TO FIND, Take 1 tablet by mouth daily after breakfast. Vitamin D3 + K2, Disp: , Rfl:    Topiramate (TOPAMAX PO), Take 100 mg by mouth 2 (two) times daily as needed. (Patient not taking: Reported on 12/03/2023), Disp: , Rfl:    No orders of the defined types were placed in this encounter.    There are no discontinued medications.   ASSESSMENT AND PLAN: .      ICD-10-CM   1. Coronary artery disease involving native coronary artery of native heart without angina pectoris  I25.10 CBC    Lipid Profile    ECHOCARDIOGRAM COMPLETE    Ambulatory referral to Internal Medicine    2. Primary hypertension  I10 CBC    Ambulatory referral to Internal Medicine    3. Hyperlipidemia LDL goal <55  E78.5 Lipid Profile    Ambulatory referral to Internal Medicine    4. Class 3 severe obesity due to excess calories with serious comorbidity and body mass index (BMI) of 40.0 to 44.9 in adult  E66.813 CBC   Z68.41 AMB Referral to [facility]    Ambulatory referral to Internal Medicine    5. Ischemic cardiomyopathy  I25.5 ECHOCARDIOGRAM COMPLETE     Assessment & Plan CAD abd recent STEMI anterior wall 09/09/23 Cardiac function is mildly reduced post-myocardial infarction, but there is no current chest discomfort or pain. Function is not significantly compromised to warrant immediate concern. Emphasize lifestyle modifications and medication adherence for long-term cardiac health, with potential recovery benefits. Schedule an echocardiogram at the next visit to assess cardiac function. Coordinate care with the primary  care physician. Order a lipid panel. Consult a pharmacist for prior authorization of Ozempic for weight management, patient is only 43 years of age with a strong family history of premature coronary disease, I have discussed potential side effects like nausea and vomiting, which may aid in appetite suppression. Obtain lipid profile testing to follow-up on his LDL.  Will also obtain CBC as he is on DAPT.  Severe obstructive sleep apnea   He has severe obstructive sleep apnea with an AHI of 59.2, severe snoring, and oxygen desaturation to 75%. Refer to Dr. Starr Eddy for management. Arrange an appointment with a sleep specialist and a CPAP machine.  Mild central sleep apnea   Mild central sleep apnea with a PHIC of 16.2 per hour contributes to overall sleep apnea severity.  He has no PCP referral made to internal medicine.  Signed,  Knox Perl, MD, [facility] 12/03/2023, 8:17 PM [facility] [address] [place], [place] [address] Phone: [phone]. Fax:  [phone]

## 2023-12-03 NOTE — Patient Instructions (Signed)
 Medication Instructions:  Your physician recommends that you continue on your current medications as directed. Please refer to the Current Medication list given to you today.  *If you need a refill on your cardiac medications before your next appointment, please call your pharmacy*  Lab Work: Have CBC and lipids checked today at [facility] on the first floor If you have labs (blood work) drawn today and your tests are completely normal, you will receive your results only by: MyChart Message (if you have MyChart) OR A paper copy in the mail If you have any lab test that is abnormal or we need to change your treatment, we will call you to review the results.  Testing/Procedures: Your physician has requested that you have an echocardiogram in 6 months. . Echocardiography is a painless test that uses sound waves to create images of your heart. It provides your doctor with information about the size and shape of your heart and how well your heart's chambers and valves are working. This procedure takes approximately one hour. There are no restrictions for this procedure. Please do NOT wear cologne, perfume, aftershave, or lotions (deodorant is allowed). Please arrive 15 minutes prior to your appointment time.  Please note: We ask at that you not bring children with you during ultrasound (echo/ vascular) testing. Due to room size and safety concerns, children are not allowed in the ultrasound rooms during exams. Our front office staff cannot provide observation of children in our lobby area while testing is being conducted. An adult accompanying a patient to their appointment will only be allowed in the ultrasound room at the discretion of the ultrasound technician under special circumstances. We apologize for any inconvenience.   Follow-Up: At [facility], you and your health needs are our priority.  As part of our continuing mission to provide you with exceptional heart care, our providers are  all part of one team.  This team includes your primary Cardiologist (physician) and Advanced Practice Providers or APPs (Physician Assistants and Nurse Practitioners) who all work together to provide you with the care you need, when you need it.  Your next appointment:   6 month(s)  Provider:   Knox Perl, MD    We recommend signing up for the patient portal called "MyChart".  Sign up information is provided on this After Visit Summary.  MyChart is used to connect with patients for Virtual Visits (Telemedicine).  Patients are able to view lab/test results, encounter notes, upcoming appointments, etc.  Non-urgent messages can be sent to your provider as well.   To learn more about what you can do with MyChart, go to [email or web address].   Other Instructions You have been referred to see the pharmacist in our office.  Please schedule new patient appointment  Our office will contact you to schedule an appointment with Dr Micael Adas regarding sleep apnea  You have been referred to see primary care at the [facility]

## 2023-12-04 ENCOUNTER — Encounter: Payer: Self-pay | Admitting: Pharmacist

## 2023-12-04 ENCOUNTER — Telehealth: Payer: Self-pay

## 2023-12-04 ENCOUNTER — Ambulatory Visit: Payer: Self-pay | Admitting: *Deleted

## 2023-12-04 ENCOUNTER — Telehealth: Payer: Self-pay | Admitting: Pharmacist

## 2023-12-04 ENCOUNTER — Other Ambulatory Visit (HOSPITAL_COMMUNITY): Payer: Self-pay

## 2023-12-04 ENCOUNTER — Ambulatory Visit: Attending: Cardiology | Admitting: Pharmacist

## 2023-12-04 DIAGNOSIS — E785 Hyperlipidemia, unspecified: Secondary | ICD-10-CM | POA: Diagnosis not present

## 2023-12-04 LAB — CBC
Hematocrit: 45.4 % (ref 37.5–51.0)
Hemoglobin: 15.2 g/dL (ref 13.0–17.7)
MCH: 30.8 pg (ref 26.6–33.0)
MCHC: 33.5 g/dL (ref 31.5–35.7)
MCV: 92 fL (ref 79–97)
Platelets: 261 10*3/uL (ref 150–450)
RBC: 4.93 x10E6/uL (ref 4.14–5.80)
RDW: 12.6 % (ref 11.6–15.4)
WBC: 6.5 10*3/uL (ref 3.4–10.8)

## 2023-12-04 LAB — LIPID PANEL
Chol/HDL Ratio: 3.4 ratio (ref 0.0–5.0)
Cholesterol, Total: 155 mg/dL (ref 100–199)
HDL: 45 mg/dL (ref >39)
LDL Chol Calc (NIH): 86 mg/dL (ref 0–99)
Triglycerides: 137 mg/dL (ref 0–149)
VLDL Cholesterol Cal: 24 mg/dL (ref 5–40)

## 2023-12-04 MED ORDER — REPATHA SURECLICK 140 MG/ML ~~LOC~~ SOAJ: 140.0000 mg | SUBCUTANEOUS | 3 refills | Status: AC

## 2023-12-04 NOTE — Telephone Encounter (Signed)
Pharmacy Patient Advocate Encounter   Received notification from Physician's Office that prior authorization for REPATHA is required/requested.   Insurance verification completed.   The patient is insured through [employer] .   Per test claim: The current 28 day co-pay is, $0.  No PA needed at this time. This test claim was processed through [facility]- copay amounts may vary at other pharmacies due to pharmacy/plan contracts, or as the patient moves through the different stages of their insurance plan.

## 2023-12-04 NOTE — Progress Notes (Signed)
 Patient ID: Mark Barrett                 DOB: Nov 02, 1980                    MRN: [id number]     HPI: Mark Barrett is a 43 y.o. male patient referred to pharmacy clinic by [facility]  to initiate GLP1-RA therapy. PMH is significant for severe OSA not on therapy, history of migraine headaches, well-controlled on NSAID, strong family history of premature coronary disease with her father having had coronary artery disease in his mid 42 years of age, presented with anterior STEMI on 09/09/2023 and underwent successful angioplasty and stenting to the proximal LAD , and obesity. Most recent weight 351 lbs BMI 41.68 kg/m .  recent STEMI on 09/09/23 and sever OSA, at-home sleep study showed severe obstructive sleep apnea with an AHI of 59.2,  Baseline weight and BMI: 318 lbs 38.72 kg/m  Current weight and BMI: 351 lbs  41.67 kg/m  Current meds that affect weight: none  Goal weight 270-280 lbs  Lost weight last year around this time and gained 10-11 lbs. With lifestyle lost 40 lbs but busy work life does not help to maintain healthy weight     Diet:  B- [place] sausage with eggs - or protein shakes  L- [employer], sandwiches, deli and grilled place  D- meat, vegetables, rice ( roasted chicken, pinto beans and potato salads)  When gets busy at work eats out   Exercise: take dog to walk   Family History:  Relation Problem Comments  Mother (Deceased) Hyperlipidemia   Hypertension   Obesity     Father (Deceased) Cancer   Heart disease (Age: 15)   Sleep apnea      Social History:   Labs: Lab Results  Component Value Date   HGBA1C 5.8 (H) 05/19/2018    Wt Readings from Last 1 Encounters:  12/03/23 (!) 351 lb 8 oz (159.4 kg)    BP Readings from Last 1 Encounters:  12/03/23 128/72   Pulse Readings from Last 1 Encounters:  12/03/23 64       Component Value Date/Time   CHOL 155 12/03/2023 1004   TRIG 137 12/03/2023 1004   HDL 45 12/03/2023 1004   CHOLHDL 3.4 12/03/2023 1004    CHOLHDL 4.6 09/10/2023 0530   VLDL 26 09/10/2023 0530   LDLCALC 86 12/03/2023 1004    Past Medical History:  Diagnosis Date   Back pain    Depression    Diarrhea    Erectile dysfunction    GERD (gastroesophageal reflux disease)    Joint pain    Migraines    Seasonal allergies     Current Outpatient Medications on File Prior to Visit  Medication Sig Dispense Refill   aspirin  81 MG chewable tablet Chew 1 tablet (81 mg total) by mouth daily. 90 tablet 2   atorvastatin  (LIPITOR) 80 MG tablet Take 1 tablet (80 mg total) by mouth daily. 90 tablet 1   calcium  carbonate (TUMS - DOSED IN MG ELEMENTAL CALCIUM ) 500 MG chewable tablet Chew 1-2 tablets by mouth daily as needed for indigestion or heartburn.     cetirizine (ZYRTEC) 10 MG tablet Take 10 mg by mouth daily.     ELDERBERRY PO Take 2 each by mouth daily after breakfast. May take an extra 2 gummies when feeling sick     losartan  (COZAAR ) 50 MG tablet Take 1 tablet (50 mg total) by mouth every  evening. 90 tablet 1   Multiple Vitamins-Minerals (EMERGEN-C IMMUNE PLUS/VIT D) CHEW Chew 2 each by mouth daily after breakfast. Gummies     nitroGLYCERIN  (NITROSTAT ) 0.4 MG SL tablet Place 1 tablet (0.4 mg total) under the tongue every 5 (five) minutes as needed. 25 tablet 2   prasugrel  (EFFIENT ) 10 MG TABS tablet Take 1 tablet (10 mg total) by mouth daily. 90 tablet 2   spironolactone  (ALDACTONE ) 25 MG tablet Take 0.5 tablets (12.5 mg total) by mouth daily. 45 tablet 3   Topiramate (TOPAMAX PO) Take 100 mg by mouth 2 (two) times daily as needed. (Patient not taking: Reported on 12/03/2023)     UNABLE TO FIND Take 1 tablet by mouth daily after breakfast. Vitamin D3 + K2     No current facility-administered medications on file prior to visit.    No Known Allergies   Assessment/Plan:  1. Weight loss - Patient has not met goal of at least 5% of body weight loss with comprehensive lifestyle modifications alone in the past 3-6 months.  Pharmacotherapy is appropriate to pursue as augmentation. Will start Zepbound coverage assessment. Confirmed patient has no personal or family history of medullary thyroid carcinoma (MTC) or Multiple Endocrine Neoplasia syndrome type 2 (MEN 2). Injection technique reviewed at today's visit.  Advised patient on common side effects including nausea, diarrhea, dyspepsia, decreased appetite, and fatigue. Counseled patient on reducing meal size and how to titrate medication to minimize side effects. Counseled patient to call if intolerable side effects or if experiencing dehydration, abdominal pain, or dizziness. Patient will adhere to dietary modifications and will target at least 150 minutes of moderate intensity exercise weekly.   2. Hyperlipidemia  Current lipid medication : Lipitor 80 mg. Given premature ASCVD LDL goal is <55 mg/dl recent LDL level while on high intensity statin was 86 mg/dl. Will add Repatha to current high intensity statin to lower LDL to goal. Follow up lab due in 2-3 months of starting Repatha   Nickola Baron, Pharm.D [place] [facility] [address], [place], [place] [address] Phone: [phone]; Fax: [phone]

## 2023-12-04 NOTE — Assessment & Plan Note (Signed)
 Current lipid medication : Lipitor 80 mg. Given premature ASCVD LDL goal is <55 mg/dl recent LDL level while on high intensity statin was 86 mg/dl. Will add Repatha to current high intensity statin to lower LDL to goal. Follow up lab due in 2-3 months of starting Repatha

## 2023-12-04 NOTE — Telephone Encounter (Signed)
 PA request has been Submitted. New Encounter has been or will be created for follow up. For additional info see Pharmacy Prior Auth telephone encounter from 12/04/23.

## 2023-12-04 NOTE — Patient Instructions (Addendum)
 GLP1 Agonist Titration Plan:  Will plan to follow the titration plan as below, pending patient is tolerating each dose before increasing to the next. Can slow titration if needed for tolerability.   Zepbound   Treatment week Recommended Zepbound (tirzepatide) dosage for weight loss and sleep apnea  Weeks 1-4  2.5 mg once a week  Weeks 5-8    5 mg once a week   Week 9 and beyond For weight loss: Can raise dosage again, if needed, by no more than 2.5 mg at once. Dosage increases should be separated by at least 4 weeks.  For sleep apnea: Raise dosage to 7.5 mg once a week. At week 13, raise it again to 10 mg once a week. Starting at week 17, your dose can be raised again, if needed, by no more than 2.5 mg at once. Dosage increases should be separated by at least 4 weeks.   Maximum dosage 15 mg once a week  Recommended maintenance dosage for weight loss  5 mg, 10 mg, or 15 mg once a week  Recommended maintenance dosage for sleep apnea 10 mg or 15 mg once a week               Your most recent labs Goal  Total Cholesterol 155 < 200  Triglycerides 137 < 150  HDL (happy/good cholesterol) 45 > 40  LDL (lousy/bad cholesterol 84 < 55   Medication changes: continue taking Atorvastatin  80 mg daily  We will start the process to get PCSK9i(Repatha or Praluent)  covered by your insurance.  Once the prior authorization is complete, we will call you to let you know and confirm pharmacy information.     Praluent is a cholesterol medication that improved your body's ability to get rid of "bad cholesterol" known as LDL. It can lower your LDL up to 60%. It is an injection that is given under the skin every 2 weeks. The most common side effects of Praluent include runny nose, symptoms of the common cold, rarely flu or flu-like symptoms, back/muscle pain in about 3-4% of the patients, and redness, pain, or bruising at the injection site.    Repatha is a cholesterol medication that improved your body's  ability to get rid of "bad cholesterol" known as LDL. It can lower your LDL up to 60%! It is an injection that is given under the skin every 2 weeks. The medication often requires a prior authorization from your insurance company. The most common side effects of Repatha include runny nose, symptoms of the common cold, rarely flu or flu-like symptoms, back/muscle pain in about 3-4% of the patients, and redness, pain, or bruising at the injection site.   Lab orders: We want to repeat labs after 2-3 months.  We will send you a lab order to remind you once we get closer to that time.

## 2023-12-04 NOTE — Telephone Encounter (Signed)
 Pharmacy Patient Advocate Encounter   Received notification from Physician's Office that prior authorization for ZEPBOUND is required/requested.   Insurance verification completed.   The patient is insured through [facility] .  Per test claim: PA required; PA submitted to above mentioned insurance via Prompt PA Key/confirmation #/[id number] Status is pending

## 2023-12-05 ENCOUNTER — Other Ambulatory Visit (HOSPITAL_COMMUNITY): Payer: Self-pay

## 2023-12-05 NOTE — Telephone Encounter (Signed)
 Pharmacy Patient Advocate Encounter  Received notification from [facility] that Prior Authorization for ZEPBOUND has been DENIED.  Full denial letter will be uploaded to the media tab. See denial reason below.  PLAN BENEFIT EXCLUSION

## 2023-12-05 NOTE — Telephone Encounter (Signed)
 Call to inform, N/A  LVM and Mychart sent

## 2023-12-06 NOTE — Progress Notes (Signed)
 I think you are following him, if not please forward to MP Monday as he is away

## 2023-12-31 ENCOUNTER — Other Ambulatory Visit: Payer: Self-pay | Admitting: *Deleted

## 2023-12-31 MED ORDER — LOSARTAN POTASSIUM 50 MG PO TABS: 50.0000 mg | ORAL_TABLET | Freq: Every evening | ORAL | 3 refills | Status: AC

## 2023-12-31 MED ORDER — ATORVASTATIN CALCIUM 80 MG PO TABS: 80.0000 mg | ORAL_TABLET | Freq: Every day | ORAL | 3 refills | Status: AC

## 2024-02-04 ENCOUNTER — Encounter (HOSPITAL_BASED_OUTPATIENT_CLINIC_OR_DEPARTMENT_OTHER): Admitting: Cardiology

## 2024-03-06 ENCOUNTER — Telehealth: Payer: Self-pay | Admitting: Pharmacist

## 2024-03-06 DIAGNOSIS — E7849 Other hyperlipidemia: Secondary | ICD-10-CM

## 2024-03-06 NOTE — Telephone Encounter (Signed)
 Call to remind for f/u lipid lab. Will be going on Aug 19 for FLP.

## 2024-03-10 LAB — LIPID PANEL
Chol/HDL Ratio: 2 ratio (ref 0.0–5.0)
Cholesterol, Total: 85 mg/dL — ABNORMAL LOW (ref 100–199)
HDL: 42 mg/dL (ref >39)
LDL Chol Calc (NIH): 22 mg/dL (ref 0–99)
Triglycerides: 117 mg/dL (ref 0–149)
VLDL Cholesterol Cal: 21 mg/dL (ref 5–40)

## 2024-03-11 ENCOUNTER — Ambulatory Visit: Payer: Self-pay | Admitting: Pharmacist

## 2024-03-16 ENCOUNTER — Encounter (HOSPITAL_BASED_OUTPATIENT_CLINIC_OR_DEPARTMENT_OTHER): Admitting: Cardiology

## 2024-03-30 ENCOUNTER — Other Ambulatory Visit: Payer: Self-pay

## 2024-04-01 MED ORDER — PRASUGREL HCL 10 MG PO TABS: 10.0000 mg | ORAL_TABLET | Freq: Every day | ORAL | 2 refills | Status: AC

## 2024-04-16 ENCOUNTER — Encounter (HOSPITAL_BASED_OUTPATIENT_CLINIC_OR_DEPARTMENT_OTHER): Admitting: Cardiology

## 2024-05-19 ENCOUNTER — Ambulatory Visit (HOSPITAL_BASED_OUTPATIENT_CLINIC_OR_DEPARTMENT_OTHER)
Admission: RE | Admit: 2024-05-19 | Discharge: 2024-05-19 | Disposition: A | Discharge status: Home / Self Care | Source: Ambulatory Visit | Attending: Cardiology | Admitting: Cardiology

## 2024-05-19 DIAGNOSIS — I255 Ischemic cardiomyopathy: Secondary | ICD-10-CM | POA: Insufficient documentation

## 2024-05-19 DIAGNOSIS — I2510 Atherosclerotic heart disease of native coronary artery without angina pectoris: Secondary | ICD-10-CM | POA: Insufficient documentation

## 2024-05-19 LAB — ECHOCARDIOGRAM COMPLETE
AR max vel: 4.09 cm2
AV Area VTI: 4.07 cm2
AV Area mean vel: 3.7 cm2
AV Mean grad: 3 mmHg
AV Peak grad: 5.2 mmHg
Ao pk vel: 1.14 m/s
Area-P 1/2: 2.99 cm2
Calc EF: 60 %
MV M vel: 2.36 m/s
MV Peak grad: 22.3 mmHg
S' Lateral: 3.7 cm
Single Plane A2C EF: 59.8 %
Single Plane A4C EF: 60.2 %

## 2024-05-19 NOTE — Progress Notes (Signed)
 Normal heart function. EF has improved to normal from mild to moderate decrease. All Good

## 2024-06-03 ENCOUNTER — Encounter (HOSPITAL_BASED_OUTPATIENT_CLINIC_OR_DEPARTMENT_OTHER): Admitting: Cardiology

## 2024-08-02 ENCOUNTER — Ambulatory Visit (HOSPITAL_BASED_OUTPATIENT_CLINIC_OR_DEPARTMENT_OTHER): Attending: Cardiology | Admitting: Cardiology

## 2024-08-02 DIAGNOSIS — R0683 Snoring: Secondary | ICD-10-CM | POA: Diagnosis present

## 2024-08-02 DIAGNOSIS — G4733 Obstructive sleep apnea (adult) (pediatric): Secondary | ICD-10-CM | POA: Diagnosis not present

## 2024-08-03 NOTE — Procedures (Addendum)
" °  Indications for Polysomnography The patient is a 44 year old Male who is 6' 5 and weighs 351.0 lbs. His BMI equals 41.9.  A full night titration treatment study was performed.  No medication was taken.No Data. Polysomnogram Data A full night polysomnogram recorded the standard physiologic parameters including EEG, EOG, EMG, EKG, nasal and oral airflow.  Respiratory parameters of chest and abdominal movements were recorded with Respiratory Inductance Plethysmography belts.   Oxygen saturation was recorded by pulse oximetry.  Sleep Architecture The total recording time of the polysomnogram was 372.4 minutes.  The total sleep time was 348.5 minutes.  The patient spent 1.3% of total sleep time in Stage N1, 35.3% in Stage N2, 32.4% in Stages N3, and 31.0% in REM.  Sleep latency was 15.3 minutes.   REM latency was 88.5 minutes.  Sleep Efficiency was 93.6%.  Wake after Sleep Onset time was 8.5 minutes.  Titration Summary The patient was titrated at pressures ranging from 8 cm/H20 up to 14 cm/H20.  The last pressure used in the study was 14 cm/H20.SABRA  Respiratory Events The polysomnogram revealed a presence of 0 obstructive, 3 centrals, and 0 mixed apneas resulting in an Apnea index of 0.5 events per hour.  There were 25 hypopneas (GreaterEqual to3% desaturation and/or arousal) resulting in an Apnea\Hypopnea Index (AHI  GreaterEqual to3% desaturation and/or arousal) of 4.8 events per hour.  There were 17 hypopneas (GreaterEqual to4% desaturation) resulting in an Apnea\Hypopnea Index (AHI GreaterEqual to4% desaturation) of 3.4 events per hour.  There were 3 Respiratory  Effort Related Arousals resulting in a RERA index of 0.5 events per hour. The Respiratory Disturbance Index is 5.3 events per hour.  The snore index was 0  events per hour.  Mean oxygen saturation was 95.5%.  The lowest oxygen saturation during sleep was 86.0%.  Time spent LessEqual to88% oxygen saturation was  minutes ().  Limb  Activity There were 81 limb movements recorded.  Of this total, 64 were classified as PLMs.  Of the PLMs, 8 were associated with arousals.  The Limb Movement index was 13.9 per hour while the PLM index was 11.0 per hour.  Cardiac Summary The average pulse rate was 55.6 bpm.  The minimum pulse rate was 45.0 bpm while the maximum pulse rate was 85.0 bpm.  Cardiac rhythm was NSR.  Diagnosis: Obstructive Sleep Apnea  Recommendations: 1. Recommend a trial of ResMed Airsense 11 CPAP at 14cm H@O  with heated humidity and large AirFit F40 full face mask. 2. Close follow-up is necessary to ensure success with CPAP or oral appliance therapy for maximum benefit. 3. A follow-up oximetry study on CPAP is recommended to assess the adequacy of therapy and determine the need for supplemental oxygen or the potential need for Bi-level therapy.  An arterial blood gas to determine the adequacy of baseline ventilation and  oxygenation should also be considered. 4. Healthy sleep recommendations include:  adequate nightly sleep (normal 7-9 hrs/night), avoidance of caffeine after noon and alcohol near bedtime, and maintaining a sleep environment that is cool, dark and quiet. 5. Weight loss for overweight patients is recommended.  Even modest amounts of weight loss can significantly improve the severity of sleep apnea. 6.  Snoring recommendations include:  weight loss where appropriate, side sleeping, and avoidance of alcohol before bed. 7. Operation of motor vehicle should be avoided when sleepy.    This study was personally reviewed and electronically signed by: Shlomo Corning, MD Accredited Board Certified in Sleep Medicine Date/Time: 08/03/2024 2:36PM "

## 2024-08-21 ENCOUNTER — Telehealth: Payer: Self-pay | Admitting: *Deleted

## 2024-08-21 NOTE — Telephone Encounter (Signed)
 Patient notified of result via his cell phone. Lmtcb.

## 2024-08-21 NOTE — Telephone Encounter (Signed)
-----   Message from Wilbert Bihari, MD sent at 08/03/2024  2:42 PM EST ----- Please let patient know that they had a successful PAP titration and let DME know that orders are in EPIC.  Please set up 6 week OV with me.

## 2024-08-21 NOTE — Telephone Encounter (Signed)
 Return call: The patient has been notified of the result and verbalized understanding.  All questions (if any) were answered. Mark Barrett, CMA 08/21/2024 4:31 PM    Upon patient request DME selection is [facility] Patient understands he will be contacted by [facility] to set up his cpap. Patient understands to call if [facility] does not contact him with new setup in a timely manner. Patient understands they will be called once confirmation has been received from [facility] that they have received their new machine to schedule 10 week follow up appointment.   [facility] notified of new cpap order  Please add to airview Patient was grateful for the call and thanked me
